# Patient Record
Sex: Female | Born: 1975 | Race: White | Hispanic: No | Marital: Married | Smoking: Never smoker
Health system: Southern US, Community
[De-identification: ages and names within clinical notes are randomized; demographics above are authoritative.]

## PROBLEM LIST (undated history)

## (undated) DIAGNOSIS — F329 Major depressive disorder, single episode, unspecified: Secondary | ICD-10-CM

## (undated) DIAGNOSIS — F32A Depression, unspecified: Secondary | ICD-10-CM

## (undated) DIAGNOSIS — E7800 Pure hypercholesterolemia, unspecified: Secondary | ICD-10-CM

## (undated) DIAGNOSIS — K219 Gastro-esophageal reflux disease without esophagitis: Secondary | ICD-10-CM

## (undated) DIAGNOSIS — F419 Anxiety disorder, unspecified: Secondary | ICD-10-CM

## (undated) HISTORY — DX: Depression, unspecified: F32.A

## (undated) HISTORY — PX: BREAST REDUCTION SURGERY: SHX8

## (undated) HISTORY — DX: Anxiety disorder, unspecified: F41.9

## (undated) HISTORY — PX: NOSE SURGERY: SHX723

## (undated) HISTORY — DX: Pure hypercholesterolemia, unspecified: E78.00

## (undated) HISTORY — DX: Gastro-esophageal reflux disease without esophagitis: K21.9

---
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
## 1998-09-16 HISTORY — PX: MOUTH SURGERY: SHX715

## 2020-01-12 ENCOUNTER — Other Ambulatory Visit: Payer: Self-pay

## 2020-01-12 ENCOUNTER — Encounter: Payer: Self-pay | Admitting: Urology

## 2020-01-12 ENCOUNTER — Ambulatory Visit: Payer: BC Managed Care – PPO | Admitting: Urology

## 2020-01-12 VITALS — BP 113/78 | HR 73 | Temp 98.1°F | Ht 64.0 in | Wt 156.0 lb

## 2020-01-12 DIAGNOSIS — N390 Urinary tract infection, site not specified: Secondary | ICD-10-CM | POA: Diagnosis not present

## 2020-01-12 LAB — POCT URINALYSIS DIPSTICK
Bilirubin, UA: NEGATIVE
Blood, UA: NEGATIVE
Clarity, UA: NEGATIVE
Glucose, UA: NEGATIVE
Ketones, UA: NEGATIVE
Nitrite, UA: NEGATIVE
Protein, UA: POSITIVE — AB
Spec Grav, UA: 1.015 (ref 1.010–1.025)
Urobilinogen, UA: 0.2 E.U./dL
pH, UA: 7 (ref 5.0–8.0)

## 2020-01-12 LAB — BLADDER SCAN AMB NON-IMAGING: Scan Result: 80.4

## 2020-01-12 NOTE — Patient Instructions (Signed)
Hematuria, Adult Hematuria is blood in the urine. Blood may be visible in the urine, or it may be identified with a test. This condition can be caused by infections of the bladder, urethra, kidney, or prostate. Other possible causes include:  Kidney stones.  Cancer of the urinary tract.  Too much calcium in the urine.  Conditions that are passed from parent to child (inherited conditions).  Exercise that requires a lot of energy. Infections can usually be treated with medicine, and a kidney stone usually will pass through your urine. If neither of these is the cause of your hematuria, more tests may be needed to identify the cause of your symptoms. It is very important to tell your health care provider about any blood in your urine, even if it is painless or the blood stops without treatment. Blood in the urine, when it happens and then stops and then happens again, can be a symptom of a very serious condition, including cancer. There is no pain in the initial stages of many urinary cancers. Follow these instructions at home: Medicines  Take over-the-counter and prescription medicines only as told by your health care provider.  If you were prescribed an antibiotic medicine, take it as told by your health care provider. Do not stop taking the antibiotic even if you start to feel better. Eating and drinking  Drink enough fluid to keep your urine clear or pale yellow. It is recommended that you drink 3-4 quarts (2.8-3.8 L) a day. If you have been diagnosed with an infection, it is recommended that you drink cranberry juice in addition to large amounts of water.  Avoid caffeine, tea, and carbonated beverages. These tend to irritate the bladder.  Avoid alcohol because it may irritate the prostate (men). General instructions  If you have been diagnosed with a kidney stone, follow your health care provider's instructions about straining your urine to catch the stone.  Empty your bladder  often. Avoid holding urine for long periods of time.  If you are female: ? After a bowel movement, wipe from front to back and use each piece of toilet paper only once. ? Empty your bladder before and after sex.  Pay attention to any changes in your symptoms. Tell your health care provider about any changes or any new symptoms.  It is your responsibility to get your test results. Ask your health care provider, or the department performing the test, when your results will be ready.  Keep all follow-up visits as told by your health care provider. This is important. Contact a health care provider if:  You develop back pain.  You have a fever.  You have nausea or vomiting.  Your symptoms do not improve after 3 days.  Your symptoms get worse. Get help right away if:  You develop severe vomiting and are unable take medicine without vomiting.  You develop severe pain in your back or abdomen even though you are taking medicine.  You pass a large amount of blood in your urine.  You pass blood clots in your urine.  You feel very weak or like you might faint.  You faint. Summary  Hematuria is blood in the urine. It has many possible causes.  It is very important that you tell your health care provider about any blood in your urine, even if it is painless or the blood stops without treatment.  Take over-the-counter and prescription medicines only as told by your health care provider.  Drink enough fluid to keep   your urine clear or pale yellow. This information is not intended to replace advice given to you by your health care provider. Make sure you discuss any questions you have with your health care provider. Document Revised: 01/27/2019 Document Reviewed: 10/05/2016 Elsevier Patient Education  2020 Elsevier Inc.  

## 2020-01-12 NOTE — Progress Notes (Signed)
Urological Symptom Review  Patient is experiencing the following symptoms: Frequent urination Hard to postpone urination Burning/pain with urination Leakage of urine Blood in urine Urinary tract infection Painful intercourse   Review of Systems  Gastrointestinal (upper)  : Negative for upper GI symptoms  Gastrointestinal (lower) : Negative for lower GI symptoms  Constitutional : Negative for symptoms  Skin: Negative for skin symptoms  Eyes: Negative for eye symptoms  Ear/Nose/Throat : Sinus problems  Hematologic/Lymphatic: Negative for Hematologic/Lymphatic symptoms  Cardiovascular : Negative for cardiovascular symptoms  Respiratory : Negative for respiratory symptoms  Endocrine: Negative for endocrine symptoms  Musculoskeletal: Back pain  Neurological: Negative for neurological symptoms  Psychologic: Depression Anxiety

## 2020-01-12 NOTE — Progress Notes (Signed)
01/12/2020 4:14 PM   Jearld Lesch 1976-03-15 [id number]  Referring provider: Rosalee Kaufman, PA-C [place],  [place] [address]  Recurrent UTIs  HPI: Ms Kristie Davidson is a 44yo here for evaluation of recurrent UTIs. For the past 2 years she has had 5-6 UTIs. She was treated multiple times with antibiotics and symptoms would improve but then return. Last UTI was 2 months ago. She has had previous episodes of gross hematuria with UTI. She gets dysuria, urinary frequency with her UTIs. No hx of nephrolithiasis. She had a renal US which per patient showed cortical thinning. No UTIs as child . She does have worsenign pain after intercourse   PMH: Past Medical History:  Diagnosis Date  . Acid reflux   . Anxiety   . Depression   . High cholesterol     Surgical History: Past Surgical History:  Procedure Laterality Date  . BREAST REDUCTION SURGERY    . CESAREAN SECTION    . MOUTH SURGERY  2000  . NOSE SURGERY      Home Medications:  Allergies as of 01/12/2020   Not on File     Medication List       Accurate as of January 12, 2020  4:14 PM. If you have any questions, ask your nurse or doctor.        amphetamine-dextroamphetamine 20 MG tablet Commonly known as: ADDERALL Take 20 mg by mouth 2 (two) times daily.   cetirizine 10 MG tablet Commonly known as: ZYRTEC Take 10 mg by mouth daily.   ferrous sulfate 325 (65 FE) MG tablet Take by mouth.   LORazepam 1 MG tablet Commonly known as: ATIVAN Take 1 mg by mouth daily.   montelukast 10 MG tablet Commonly known as: SINGULAIR Take 10 mg by mouth daily.   omeprazole 20 MG capsule Commonly known as: PRILOSEC Take 20 mg by mouth daily.   predniSONE 20 MG tablet Commonly known as: DELTASONE Take 40 mg by mouth daily.   simvastatin 20 MG tablet Commonly known as: ZOCOR SMARTSIG:1 Tablet(s) By Mouth Every Evening       Allergies: Not on File  Family History: Family History  Problem Relation Age  of Onset  . Kidney Stones Mother   . Kidney Stones Sister     Social History:  reports that she has never smoked. She has never used smokeless tobacco. No history on file for alcohol and drug.  ROS: All other review of systems were reviewed and are negative except what is noted above in HPI  Physical Exam: BP 113/78   Pulse 73   Temp 98.1 F (36.7 C)   Ht 5\' 4"  (1.626 m)   Wt 156 lb (70.8 kg)   BMI 26.78 kg/m   Constitutional:  Alert and oriented, No acute distress. HEENT: [place] AT, moist mucus membranes.  Trachea midline, no masses. Cardiovascular: No clubbing, cyanosis, or edema. Respiratory: Normal respiratory effort, no increased work of breathing. GI: Abdomen is soft, nontender, nondistended, no abdominal masses GU: No CVA tenderness.   Lymph: No cervical or inguinal lymphadenopathy. Skin: No rashes, bruises or suspicious lesions. Neurologic: Grossly intact, no focal deficits, moving all 4 extremities. Psychiatric: Normal mood and affect.  Laboratory Data: No results found for: WBC, HGB, HCT, MCV, PLT  No results found for: CREATININE  No results found for: PSA  No results found for: TESTOSTERONE  No results found for: HGBA1C  Urinalysis    Component Value Date/Time   BILIRUBINUR neg 01/12/2020  1552   PROTEINUR Positive (A) 01/12/2020 1552   UROBILINOGEN 0.2 01/12/2020 1552   NITRITE neg 01/12/2020 1552   LEUKOCYTESUR Moderate (2+) (A) 01/12/2020 1552    No results found for: LABMICR, WBCUA, RBCUA, LABEPIT, MUCUS, BACTERIA  Pertinent Imaging:  No results found for this or any previous visit. No results found for this or any previous visit. No results found for this or any previous visit. No results found for this or any previous visit. No results found for this or any previous visit. No results found for this or any previous visit. No results found for this or any previous visit. No results found for this or any previous visit.  Assessment & Plan:     1. Recurrent UTI -We discussed the workup including BMP and CT hematuria. - BLADDER SCAN AMB NON-IMAGING - POCT urinalysis dipstick   No follow-ups on file.  Wilkie Aye, MD  [facility] [place]

## 2020-02-02 ENCOUNTER — Other Ambulatory Visit: Payer: Self-pay | Admitting: Urology

## 2020-02-03 LAB — BASIC METABOLIC PANEL
BUN: 13 mg/dL (ref 7–25)
CO2: 29 mmol/L (ref 20–32)
Calcium: 9.3 mg/dL (ref 8.6–10.2)
Chloride: 103 mmol/L (ref 98–110)
Creat: 0.77 mg/dL (ref 0.50–1.10)
Glucose, Bld: 98 mg/dL (ref 65–139)
Potassium: 3.7 mmol/L (ref 3.5–5.3)
Sodium: 138 mmol/L (ref 135–146)

## 2020-02-08 NOTE — Progress Notes (Signed)
Letter mailed

## 2020-02-09 ENCOUNTER — Ambulatory Visit (HOSPITAL_COMMUNITY): Admission: RE | Admit: 2020-02-09 | Payer: BC Managed Care – PPO | Source: Ambulatory Visit

## 2020-02-15 ENCOUNTER — Ambulatory Visit (HOSPITAL_COMMUNITY)
Admission: RE | Admit: 2020-02-15 | Discharge: 2020-02-15 | Disposition: A | Discharge status: Home / Self Care | Payer: BC Managed Care – PPO | Source: Ambulatory Visit | Attending: Urology | Admitting: Urology

## 2020-02-15 ENCOUNTER — Other Ambulatory Visit: Payer: Self-pay

## 2020-02-15 DIAGNOSIS — N390 Urinary tract infection, site not specified: Secondary | ICD-10-CM | POA: Diagnosis present

## 2020-02-15 MED ORDER — IOHEXOL 300 MG/ML  SOLN
150.0000 mL | Freq: Once | INTRAMUSCULAR | Status: AC | PRN
  Administered 2020-02-15: 125 mL via INTRAVENOUS

## 2020-02-16 ENCOUNTER — Ambulatory Visit (INDEPENDENT_AMBULATORY_CARE_PROVIDER_SITE_OTHER): Payer: BC Managed Care – PPO | Admitting: Urology

## 2020-02-16 ENCOUNTER — Encounter: Payer: Self-pay | Admitting: Urology

## 2020-02-16 VITALS — BP 115/81 | HR 73 | Temp 97.9°F | Ht 64.0 in | Wt 160.0 lb

## 2020-02-16 DIAGNOSIS — N200 Calculus of kidney: Secondary | ICD-10-CM | POA: Diagnosis not present

## 2020-02-16 DIAGNOSIS — N390 Urinary tract infection, site not specified: Secondary | ICD-10-CM | POA: Diagnosis not present

## 2020-02-16 MED ORDER — TAMSULOSIN HCL 0.4 MG PO CAPS: 0.4000 mg | ORAL_CAPSULE | Freq: Every day | ORAL | 11 refills | Status: DC

## 2020-02-16 NOTE — Progress Notes (Signed)
Urological Symptom Review  Patient is experiencing the following symptoms: Frequent urination Hard to postpone urination Painful intercourse   Review of Systems  Gastrointestinal (upper)  : Indigestion/heartburn  Gastrointestinal (lower) : Negative for lower GI symptoms  Constitutional : Negative for symptoms  Skin: Negative for skin symptoms  Eyes: Negative for eye symptoms  Ear/Nose/Throat : Negative for Ear/Nose/Throat symptoms  Hematologic/Lymphatic: Negative for Hematologic/Lymphatic symptoms  Cardiovascular : Negative for cardiovascular symptoms  Respiratory : Negative for respiratory symptoms  Endocrine: Negative for endocrine symptoms  Musculoskeletal: Back pain  Neurological: Negative for neurological symptoms  Psychologic: Negative for psychiatric symptoms

## 2020-02-16 NOTE — Progress Notes (Signed)
02/16/2020 3:16 PM   Kristie Davidson 09-11-1976 [id number]  Referring provider: No referring provider defined for this encounter.  Recurrent UTI  HPI: Kristie Davidson is a 43yo here for followup for recurrent UTI. CT hematuria shows nephrocalcinosis. She has intermittent left flank pain that is sharp and mild. No fevers. NO hematuria.   PMH: Past Medical History:  Diagnosis Date  . Acid reflux   . Anxiety   . Depression   . High cholesterol     Surgical History: Past Surgical History:  Procedure Laterality Date  . BREAST REDUCTION SURGERY    . CESAREAN SECTION    . MOUTH SURGERY  2000  . NOSE SURGERY      Home Medications:  Allergies as of 02/16/2020   Not on File     Medication List       Accurate as of February 16, 2020  3:16 PM. If you have any questions, ask your nurse or doctor.        amphetamine-dextroamphetamine 20 MG tablet Commonly known as: ADDERALL Take 20 mg by mouth 2 (two) times daily.   cetirizine 10 MG tablet Commonly known as: ZYRTEC Take 10 mg by mouth daily.   ferrous sulfate 325 (65 FE) MG tablet Take by mouth.   LORazepam 1 MG tablet Commonly known as: ATIVAN Take 1 mg by mouth as needed.   montelukast 10 MG tablet Commonly known as: SINGULAIR Take 10 mg by mouth daily.   omeprazole 20 MG capsule Commonly known as: PRILOSEC Take 20 mg by mouth daily.   predniSONE 20 MG tablet Commonly known as: DELTASONE Take 40 mg by mouth daily.   simvastatin 20 MG tablet Commonly known as: ZOCOR SMARTSIG:1 Tablet(s) By Mouth Every Evening       Allergies: Not on File  Family History: Family History  Problem Relation Age of Onset  . Kidney Stones Mother   . Kidney Stones Sister     Social History:  reports that she has never smoked. She has never used smokeless tobacco. No history on file for alcohol and drug.  ROS: All other review of systems were reviewed and are negative except what is noted above in HPI  Physical Exam: BP  115/81   Pulse 73   Temp 97.9 F (36.6 C)   Ht 5\' 4"  (1.626 m)   Wt 160 lb (72.6 kg)   BMI 27.46 kg/m   Constitutional:  Alert and oriented, No acute distress. HEENT: [place] AT, moist mucus membranes.  Trachea midline, no masses. Cardiovascular: No clubbing, cyanosis, or edema. Respiratory: Normal respiratory effort, no increased work of breathing. GI: Abdomen is soft, nontender, nondistended, no abdominal masses GU: No CVA tenderness. .  Lymph: No cervical or inguinal lymphadenopathy. Skin: No rashes, bruises or suspicious lesions. Neurologic: Grossly intact, no focal deficits, moving all 4 extremities. Psychiatric: Normal mood and affect.  Laboratory Data: No results found for: WBC, HGB, HCT, MCV, PLT  Lab Results  Component Value Date   CREATININE 0.77 02/02/2020    No results found for: PSA  No results found for: TESTOSTERONE  No results found for: HGBA1C  Urinalysis    Component Value Date/Time   BILIRUBINUR neg 01/12/2020 1552   PROTEINUR Positive (A) 01/12/2020 1552   UROBILINOGEN 0.2 01/12/2020 1552   NITRITE neg 01/12/2020 1552   LEUKOCYTESUR Moderate (2+) (A) 01/12/2020 1552    No results found for: LABMICR, WBCUA, RBCUA, LABEPIT, MUCUS, BACTERIA  Pertinent Imaging: CT hematuria 02/15/2020: Images reviewed and discussed with  the patient No results found for this or any previous visit. No results found for this or any previous visit. No results found for this or any previous visit. No results found for this or any previous visit. No results found for this or any previous visit. No results found for this or any previous visit. Results for orders placed during the hospital encounter of 02/15/20  CT HEMATURIA WORKUP   Narrative CLINICAL DATA:  Gross hematuria. Recurrent urinary tract infections.  EXAM: CT ABDOMEN AND PELVIS WITHOUT AND WITH CONTRAST  TECHNIQUE: Multidetector CT imaging of the abdomen and pelvis was performed following the standard  protocol before and following the bolus administration of intravenous contrast.  CONTRAST:  170mL OMNIPAQUE IOHEXOL 300 MG/ML  SOLN  COMPARISON:  None.  FINDINGS: Lower Chest: No acute findings.  Hepatobiliary: No hepatic masses identified. Gallbladder is unremarkable. No evidence of biliary ductal dilatation.  Pancreas:  No mass or inflammatory changes.  Spleen: Within normal limits in size and appearance.  Adrenals/Urinary Tract: No adrenal masses identified. Numerous tiny calcifications are seen throughout the medullary pyramids both kidneys, consistent with medullary nephrocalcinosis. No evidence ureteral calculi or hydronephrosis. No renal masses identified. No masses seen involving the ureters or bladder.  Stomach/Bowel: No evidence of obstruction, inflammatory process or abnormal fluid collections. Normal appendix visualized.  Vascular/Lymphatic: No pathologically enlarged lymph nodes. No abdominal aortic aneurysm.  Reproductive:  No mass or other significant abnormality.  Other:  None.  Musculoskeletal:  No suspicious bone lesions identified.  IMPRESSION: 1. Bilateral renal medullary nephrocalcinosis. No evidence of ureteral calculi or hydronephrosis. 2. No radiographic evidence of urinary tract neoplasm.   Electronically Signed   By: Marlaine Hind M.D.   On: 02/16/2020 11:52    No results found for this or any previous visit.  Assessment & Plan:    1. Recurrent UTI -CT hematuria shows nephrocalcinosis.  2. Nephrolithiasis -Flomax 0.4mg  dai;y -motrin prn   No follow-ups on file.  Nicolette Bang, MD  [facility] [place]

## 2020-02-16 NOTE — Patient Instructions (Signed)

## 2020-03-23 ENCOUNTER — Encounter: Payer: Self-pay | Admitting: Urology

## 2020-03-23 ENCOUNTER — Other Ambulatory Visit: Payer: Self-pay

## 2020-03-23 ENCOUNTER — Ambulatory Visit (INDEPENDENT_AMBULATORY_CARE_PROVIDER_SITE_OTHER): Payer: BC Managed Care – PPO | Admitting: Urology

## 2020-03-23 VITALS — BP 96/63 | HR 80 | Temp 97.9°F | Ht 64.0 in | Wt 163.0 lb

## 2020-03-23 DIAGNOSIS — N200 Calculus of kidney: Secondary | ICD-10-CM

## 2020-03-23 DIAGNOSIS — N390 Urinary tract infection, site not specified: Secondary | ICD-10-CM | POA: Diagnosis not present

## 2020-03-23 MED ORDER — TAMSULOSIN HCL 0.4 MG PO CAPS: 0.4000 mg | ORAL_CAPSULE | Freq: Every day | ORAL | 3 refills | Status: DC

## 2020-03-23 NOTE — Patient Instructions (Signed)

## 2020-03-23 NOTE — Progress Notes (Signed)
Urological Symptom Review ° °Patient is experiencing the following symptoms: °Frequent urination °Hard to postpone urination ° ° °Review of Systems ° °Gastrointestinal (upper)  : °Negative for upper GI symptoms ° °Gastrointestinal (lower) : °Negative for lower GI symptoms ° °Constitutional : °Negative for symptoms ° °Skin: °Negative for skin symptoms ° °Eyes: °Negative for eye symptoms ° °Ear/Nose/Throat : °Negative for Ear/Nose/Throat symptoms ° °Hematologic/Lymphatic: °Negative for Hematologic/Lymphatic symptoms ° °Cardiovascular : °Negative for cardiovascular symptoms ° °Respiratory : °Negative for respiratory symptoms ° °Endocrine: °Negative for endocrine symptoms ° °Musculoskeletal: °Negative for musculoskeletal symptoms ° °Neurological: °Negative for neurological symptoms ° °Psychologic: °Negative for psychiatric symptoms ° °

## 2020-03-23 NOTE — Progress Notes (Signed)
03/23/2020 11:14 AM   Kristie Davidson 07-12-1976 [id number]  Referring provider: Practice, [facility] [address] [place],  [place] [address]  Nephrolithasis and back pain  HPI: Kristie Davidson is a 43yo here for followup for recurrent UTI. No UTIs since last visit. Stream good. Flank pain resolved with flomax 0.4mg  daily. She does have mild SUI with coughing spells.    PMH: Past Medical History:  Diagnosis Date  . Acid reflux   . Anxiety   . Depression   . High cholesterol     Surgical History: Past Surgical History:  Procedure Laterality Date  . BREAST REDUCTION SURGERY    . CESAREAN SECTION    . MOUTH SURGERY  2000  . NOSE SURGERY      Home Medications:  Allergies as of 03/23/2020      Reactions   Sumatriptan    Nausea,cold sweats,tremors,chills      Medication List       Accurate as of March 23, 2020 11:14 AM. If you have any questions, ask your nurse or doctor.        STOP taking these medications   predniSONE 20 MG tablet Commonly known as: DELTASONE Stopped by: Wilkie Aye, MD     TAKE these medications   amphetamine-dextroamphetamine 20 MG tablet Commonly known as: ADDERALL Take 20 mg by mouth 2 (two) times daily.   cetirizine 10 MG tablet Commonly known as: ZYRTEC Take 10 mg by mouth daily.   ferrous sulfate 325 (65 FE) MG tablet Take by mouth.   LORazepam 1 MG tablet Commonly known as: ATIVAN Take 1 mg by mouth as needed.   montelukast 10 MG tablet Commonly known as: SINGULAIR Take 10 mg by mouth daily.   omeprazole 20 MG capsule Commonly known as: PRILOSEC Take 20 mg by mouth daily.   simvastatin 20 MG tablet Commonly known as: ZOCOR SMARTSIG:1 Tablet(s) By Mouth Every Evening   tamsulosin 0.4 MG Caps capsule Commonly known as: FLOMAX Take 1 capsule (0.4 mg total) by mouth daily.       Allergies:  Allergies  Allergen Reactions  . Sumatriptan     Nausea,cold sweats,tremors,chills    Family History: Family History    Problem Relation Age of Onset  . Kidney Stones Mother   . Kidney Stones Sister     Social History:  reports that she has never smoked. She has never used smokeless tobacco. No history on file for alcohol use and drug use.  ROS: All other review of systems were reviewed and are negative except what is noted above in HPI  Physical Exam: BP 96/63   Pulse 80   Temp 97.9 F (36.6 C)   Ht 5\' 4"  (1.626 m)   Wt 163 lb (73.9 kg)   BMI 27.98 kg/m   Constitutional:  Alert and oriented, No acute distress. HEENT: [place] AT, moist mucus membranes.  Trachea midline, no masses. Cardiovascular: No clubbing, cyanosis, or edema. Respiratory: Normal respiratory effort, no increased work of breathing. GI: Abdomen is soft, nontender, nondistended, no abdominal masses GU: No CVA tenderness.  Lymph: No cervical or inguinal lymphadenopathy. Skin: No rashes, bruises or suspicious lesions. Neurologic: Grossly intact, no focal deficits, moving all 4 extremities. Psychiatric: Normal mood and affect.  Laboratory Data: No results found for: WBC, HGB, HCT, MCV, PLT  Lab Results  Component Value Date   CREATININE 0.77 02/02/2020    No results found for: PSA  No results found for: TESTOSTERONE  No results found for:  HGBA1C  Urinalysis    Component Value Date/Time   BILIRUBINUR neg 01/12/2020 1552   PROTEINUR Positive (A) 01/12/2020 1552   UROBILINOGEN 0.2 01/12/2020 1552   NITRITE neg 01/12/2020 1552   LEUKOCYTESUR Moderate (2+) (A) 01/12/2020 1552    No results found for: LABMICR, WBCUA, RBCUA, LABEPIT, MUCUS, BACTERIA  Pertinent Imaging:  No results found for this or any previous visit.  No results found for this or any previous visit.  No results found for this or any previous visit.  No results found for this or any previous visit.  No results found for this or any previous visit.  No results found for this or any previous visit.  Results for orders placed during the hospital  encounter of 02/15/20  CT HEMATURIA WORKUP  Narrative CLINICAL DATA:  Gross hematuria. Recurrent urinary tract infections.  EXAM: CT ABDOMEN AND PELVIS WITHOUT AND WITH CONTRAST  TECHNIQUE: Multidetector CT imaging of the abdomen and pelvis was performed following the standard protocol before and following the bolus administration of intravenous contrast.  CONTRAST:  OMNIPAQUE IOHEXOL 300 MG/ML  SOLN  COMPARISON:  None.  FINDINGS: Lower Chest: No acute findings.  Hepatobiliary: No hepatic masses identified. Gallbladder is unremarkable. No evidence of biliary ductal dilatation.  Pancreas:  No mass or inflammatory changes.  Spleen: Within normal limits in size and appearance.  Adrenals/Urinary Tract: No adrenal masses identified. Numerous tiny calcifications are seen throughout the medullary pyramids both kidneys, consistent with medullary nephrocalcinosis. No evidence ureteral calculi or hydronephrosis. No renal masses identified. No masses seen involving the ureters or bladder.  Stomach/Bowel: No evidence of obstruction, inflammatory process or abnormal fluid collections. Normal appendix visualized.  Vascular/Lymphatic: No pathologically enlarged lymph nodes. No abdominal aortic aneurysm.  Reproductive:  No mass or other significant abnormality.  Other:  None.  Musculoskeletal:  No suspicious bone lesions identified.  IMPRESSION: 1. Bilateral renal medullary nephrocalcinosis. No evidence of ureteral calculi or hydronephrosis. 2. No radiographic evidence of urinary tract neoplasm.   Electronically Signed By: Danae Orleans M.D. On: 02/16/2020 11:52  No results found for this or any previous visit.   Assessment & Plan:    1. Recurrent UTI -continue observation -continue flomax 0.4mg  daily  2. Nephrolithiasis -RTC 6 months with renal US and KUB  3. Mild SUI -we will trial mirabegron   No follow-ups on file.  Wilkie Aye, MD  [facility] [place]

## 2020-04-05 ENCOUNTER — Telehealth: Payer: Self-pay | Admitting: Urology

## 2020-04-05 NOTE — Telephone Encounter (Signed)
Pt unsure of dose. You gave her samples of myrbetriq. I assuming you start with 25mg ? Pt is wanting a Rx

## 2020-04-05 NOTE — Telephone Encounter (Signed)
Called message to verify sample medication dosage. Left message to return call.

## 2020-04-05 NOTE — Telephone Encounter (Signed)
PT STATES MED SAMPLE THAT WAS GIVEN AT LAST VISIT WORKED AND SHE WOULD LIKE A RX SENT TO [facility].

## 2020-04-11 ENCOUNTER — Other Ambulatory Visit: Payer: Self-pay

## 2020-04-11 DIAGNOSIS — N390 Urinary tract infection, site not specified: Secondary | ICD-10-CM

## 2020-04-11 MED ORDER — MIRABEGRON ER 25 MG PO TB24: 25.0000 mg | ORAL_TABLET | Freq: Every day | ORAL | 11 refills | Status: DC

## 2020-04-11 NOTE — Telephone Encounter (Signed)
It was 2.5 mg.

## 2020-04-11 NOTE — Telephone Encounter (Signed)
Rx sent in for pt. Pt notified.

## 2020-09-21 ENCOUNTER — Other Ambulatory Visit: Payer: Self-pay

## 2020-09-21 ENCOUNTER — Ambulatory Visit (HOSPITAL_COMMUNITY)
Admission: RE | Admit: 2020-09-21 | Discharge: 2020-09-21 | Disposition: A | Discharge status: Home / Self Care | Payer: 59 | Source: Ambulatory Visit | Attending: Urology | Admitting: Urology

## 2020-09-21 DIAGNOSIS — N200 Calculus of kidney: Secondary | ICD-10-CM | POA: Diagnosis present

## 2020-09-28 ENCOUNTER — Ambulatory Visit: Payer: BC Managed Care – PPO | Admitting: Urology

## 2020-11-15 ENCOUNTER — Other Ambulatory Visit: Payer: Self-pay

## 2020-11-15 ENCOUNTER — Ambulatory Visit (INDEPENDENT_AMBULATORY_CARE_PROVIDER_SITE_OTHER): Payer: 59 | Admitting: Urology

## 2020-11-15 ENCOUNTER — Encounter: Payer: Self-pay | Admitting: Urology

## 2020-11-15 VITALS — BP 127/83 | HR 91 | Temp 99.2°F

## 2020-11-15 DIAGNOSIS — N200 Calculus of kidney: Secondary | ICD-10-CM | POA: Diagnosis not present

## 2020-11-15 LAB — URINALYSIS, ROUTINE W REFLEX MICROSCOPIC
Bilirubin, UA: NEGATIVE
Glucose, UA: NEGATIVE
Leukocytes,UA: NEGATIVE
Nitrite, UA: NEGATIVE
RBC, UA: NEGATIVE
Specific Gravity, UA: >1.03 — ABNORMAL HIGH (ref 1.005–1.030)
Urobilinogen, Ur: 1 mg/dL (ref 0.2–1.0)
pH, UA: 5.5 (ref 5.0–7.5)

## 2020-11-15 MED ORDER — TAMSULOSIN HCL 0.4 MG PO CAPS: 0.4000 mg | ORAL_CAPSULE | Freq: Every day | ORAL | 3 refills | Status: DC

## 2020-11-15 NOTE — Progress Notes (Signed)
Urological Symptom Review  Patient is experiencing the following symptoms: Frequent urination Leakage of urine   Review of Systems  Gastrointestinal (upper)  : Negative for upper GI symptoms  Gastrointestinal (lower) : Negative for lower GI symptoms  Constitutional : Negative for symptoms  Skin: Negative for skin symptoms  Eyes: Negative for eye symptoms  Ear/Nose/Throat : Negative for Ear/Nose/Throat symptoms  Hematologic/Lymphatic: Easy bruising  Cardiovascular : Negative for cardiovascular symptoms  Respiratory : Negative for respiratory symptoms  Endocrine: negative  Musculoskeletal: Negative for musculoskeletal symptoms  Neurological: Headaches  Psychologic: Negative for psychiatric symptoms

## 2020-11-15 NOTE — Patient Instructions (Signed)

## 2020-11-15 NOTE — Progress Notes (Signed)
11/15/2020 3:45 PM   Kristie Davidson [birth year] [id number]  Referring provider: Practice, [facility] [address] [place],  [place] [address]  followup recurrent UTI and nephrolithiasis  HPI: Kristie Davidson is a 44yo here for followup for recurrent UTI and nephrolithiasis. No UTIs since last visit. No stone events since last visit. Renal US shows nephrocalcinosis. No flank pain. No worsening LUTS. She is on flomax 0.4mg  daily.    PMH: Past Medical History:  Diagnosis Date  . Acid reflux   . Anxiety   . Depression   . High cholesterol     Surgical History: Past Surgical History:  Procedure Laterality Date  . BREAST REDUCTION SURGERY    . CESAREAN SECTION    . MOUTH SURGERY  2000  . NOSE SURGERY      Home Medications:  Allergies as of 11/15/2020      Reactions   Sumatriptan    Nausea,cold sweats,tremors,chills      Medication List       Accurate as of November 15, 2020  3:45 PM. If you have any questions, ask your nurse or doctor.        amphetamine-dextroamphetamine 20 MG tablet Commonly known as: ADDERALL Take 20 mg by mouth 2 (two) times daily.   cetirizine 10 MG tablet Commonly known as: ZYRTEC Take 10 mg by mouth daily.   ferrous sulfate 325 (65 FE) MG tablet Take by mouth.   LORazepam 1 MG tablet Commonly known as: ATIVAN Take 1 mg by mouth as needed.   mirabegron ER 25 MG Tb24 tablet Commonly known as: MYRBETRIQ Take 1 tablet (25 mg total) by mouth daily.   montelukast 10 MG tablet Commonly known as: SINGULAIR Take 10 mg by mouth daily.   omeprazole 20 MG capsule Commonly known as: PRILOSEC Take 20 mg by mouth daily.   simvastatin 20 MG tablet Commonly known as: ZOCOR SMARTSIG:1 Tablet(s) By Mouth Every Evening   tamsulosin 0.4 MG Caps capsule Commonly known as: FLOMAX Take 1 capsule (0.4 mg total) by mouth daily.       Allergies:  Allergies  Allergen Reactions  . Sumatriptan     Nausea,cold sweats,tremors,chills    Family  History: Family History  Problem Relation Age of Onset  . Kidney Stones Mother   . Kidney Stones Sister     Social History:  reports that she has never smoked. She has never used smokeless tobacco. No history on file for alcohol use and drug use.  ROS: All other review of systems were reviewed and are negative except what is noted above in HPI  Physical Exam: BP 127/83   Pulse 91   Temp 99.2 F (37.3 C)   Constitutional:  Alert and oriented, No acute distress. HEENT: Kristie Davidson AT, moist mucus membranes.  Trachea midline, no masses. Cardiovascular: No clubbing, cyanosis, or edema. Respiratory: Normal respiratory effort, no increased work of breathing. GI: Abdomen is soft, nontender, nondistended, no abdominal masses GU: No CVA tenderness.  Lymph: No cervical or inguinal lymphadenopathy. Skin: No rashes, bruises or suspicious lesions. Neurologic: Grossly intact, no focal deficits, moving all 4 extremities. Psychiatric: Normal mood and affect.  Laboratory Data: No results found for: WBC, HGB, HCT, MCV, PLT  Lab Results  Component Value Date   CREATININE 0.77 02/02/2020    No results found for: PSA  No results found for: TESTOSTERONE  No results found for: HGBA1C  Urinalysis    Component Value Date/Time   BILIRUBINUR neg 01/12/2020 1552   PROTEINUR  Positive (A) 01/12/2020 1552   UROBILINOGEN 0.2 01/12/2020 1552   NITRITE neg 01/12/2020 1552   LEUKOCYTESUR Moderate (2+) (A) 01/12/2020 1552    No results found for: LABMICR, WBCUA, RBCUA, LABEPIT, MUCUS, BACTERIA  Pertinent Imaging: Renal US 09/21/2020: Images reviewed and discussed with the patient No results found for this or any previous visit.  No results found for this or any previous visit.  No results found for this or any previous visit.  No results found for this or any previous visit.  Results for orders placed during the hospital encounter of 09/21/20  Ultrasound renal complete  Narrative CLINICAL DATA:   Nephrolithiasis  EXAM: RENAL / URINARY TRACT ULTRASOUND COMPLETE  COMPARISON:  Renal ultrasound November 18, 2019; CT abdomen and pelvis February 15, 2020  FINDINGS: Right Kidney:  Renal measurements: 10.9 x 4.8 x 6.2 cm = volume: 170.3 mL. Echogenicity within normal limits. No mass or hydronephrosis visualized. Subtle apparent medullary nephrocalcinosis, better seen on recent CT. No ureterectasis.  Left Kidney:  Renal measurements: 10.8 x 5.5 x 4.6 cm = volume: 141.4 mL. Echogenicity within normal limits. No mass or hydronephrosis visualized. Subtle apparent medullary nephrocalcinosis, better seen on recent CT. No ureterectasis.  Bladder:  Appears normal for degree of bladder distention. Flow from each distal ureter seen in the bladder.  Other:  None.  IMPRESSION: 1. Subtle changes of apparent medullary nephrocalcinosis, better seen on prior CT.  2. Kidneys otherwise appear normal bilaterally. No lesion involving urinary bladder.   Electronically Signed By: Bretta Bang III M.D. On: 09/21/2020 14:09  No results found for this or any previous visit.  Results for orders placed during the hospital encounter of 02/15/20  CT HEMATURIA WORKUP  Narrative CLINICAL DATA:  Gross hematuria. Recurrent urinary tract infections.  EXAM: CT ABDOMEN AND PELVIS WITHOUT AND WITH CONTRAST  TECHNIQUE: Multidetector CT imaging of the abdomen and pelvis was performed following the standard protocol before and following the bolus administration of intravenous contrast.  CONTRAST:  OMNIPAQUE IOHEXOL 300 MG/ML  SOLN  COMPARISON:  None.  FINDINGS: Lower Chest: No acute findings.  Hepatobiliary: No hepatic masses identified. Gallbladder is unremarkable. No evidence of biliary ductal dilatation.  Pancreas:  No mass or inflammatory changes.  Spleen: Within normal limits in size and appearance.  Adrenals/Urinary Tract: No adrenal masses identified. Numerous  tiny calcifications are seen throughout the medullary pyramids both kidneys, consistent with medullary nephrocalcinosis. No evidence ureteral calculi or hydronephrosis. No renal masses identified. No masses seen involving the ureters or bladder.  Stomach/Bowel: No evidence of obstruction, inflammatory process or abnormal fluid collections. Normal appendix visualized.  Vascular/Lymphatic: No pathologically enlarged lymph nodes. No abdominal aortic aneurysm.  Reproductive:  No mass or other significant abnormality.  Other:  None.  Musculoskeletal:  No suspicious bone lesions identified.  IMPRESSION: 1. Bilateral renal medullary nephrocalcinosis. No evidence of ureteral calculi or hydronephrosis. 2. No radiographic evidence of urinary tract neoplasm.   Electronically Signed By: Danae Orleans M.D. On: 02/16/2020 11:52  No results found for this or any previous visit.   Assessment & Plan:    1. Nephrolithiasis -Continue flomax 0.4mg  daily  -RTC 1 year with renal US - Urinalysis, Routine w reflex microscopic   No follow-ups on file.  Wilkie Aye, MD  [facility] [place]

## 2020-12-27 IMAGING — CT CT ABD-PEL WO/W CM
3 of 8 series · 11 of 46 positions shown, 17 images · IV contrast (omnipaque)
Comparison: None.

CLINICAL DATA: Gross hematuria. Recurrent urinary tract infections.

EXAM:
CT ABDOMEN AND PELVIS WITHOUT AND WITH CONTRAST
TECHNIQUE: Multidetector CT imaging of the abdomen and pelvis was performed
following the standard protocol before and following the bolus
administration of intravenous contrast.
CONTRAST:  125mL OMNIPAQUE IOHEXOL 300 MG/ML  SOLN

[Series 3: axial pre · axial · non-contrast · 0.67mm/px · 1 of 99 slices shown]
[im 13/99  soft-tissue]
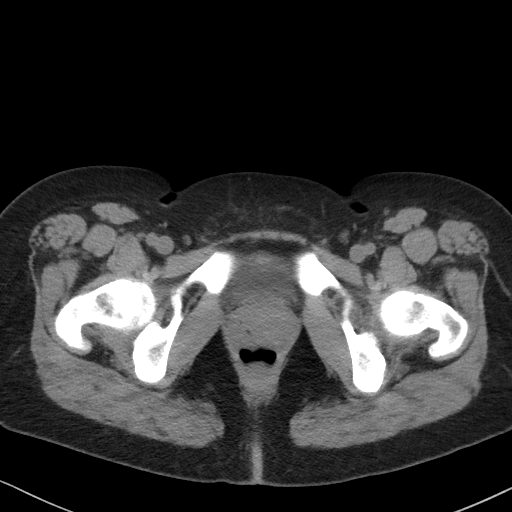

[Series 6: coronal pre · coronal · non-contrast · 0.68mm/px · 3 of 100 slices shown, 4 images]
[im 25/100  soft-tissue]
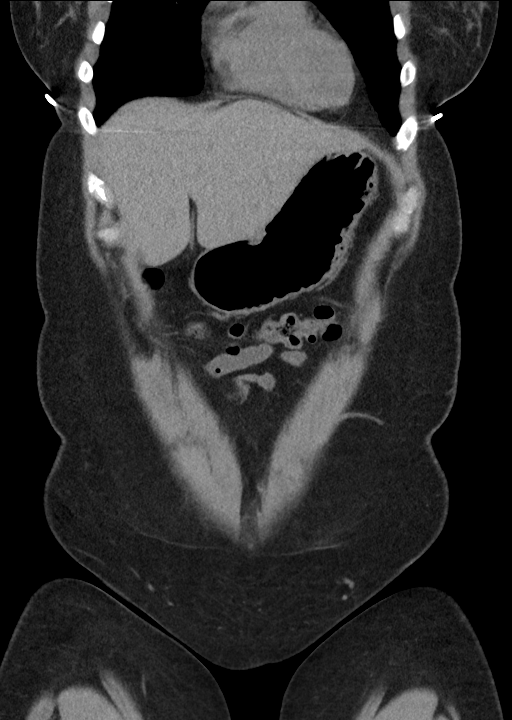
[im 50/100  soft-tissue]
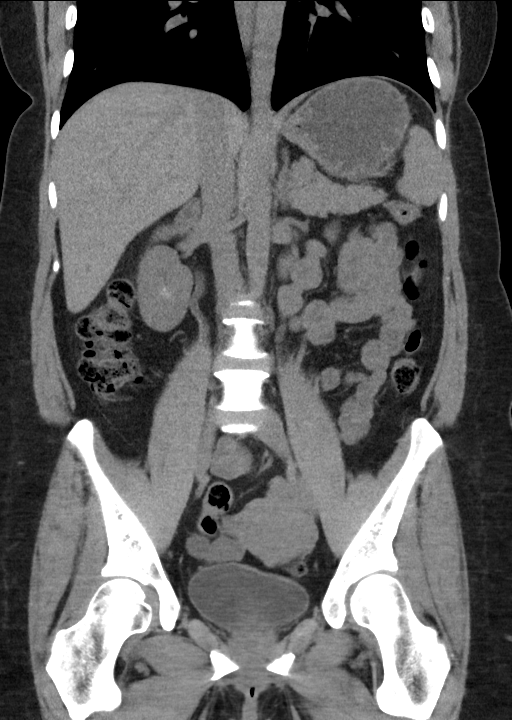
[im 50/100  bone]
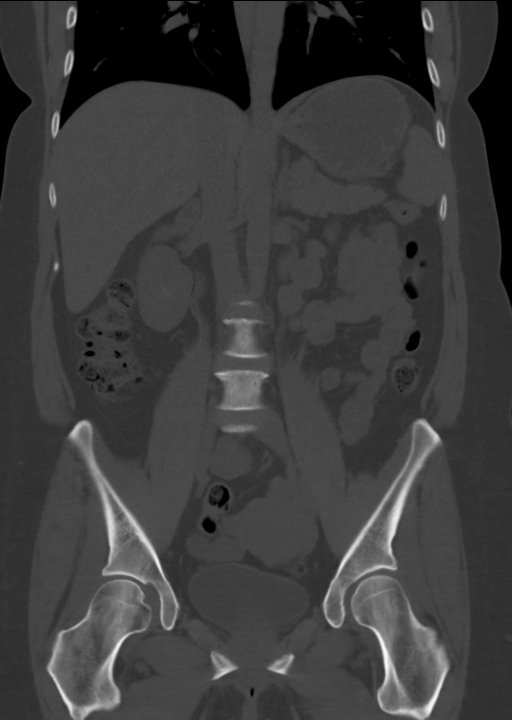
[im 75/100  soft-tissue]
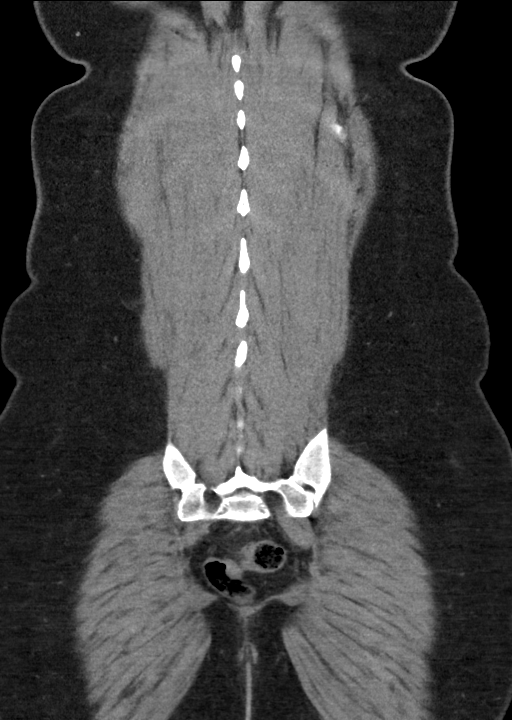

[Series 8: axial delay · axial · delayed · 0.80mm/px · z∈[-654,-264]mm · 7 of 104 slices shown, 12 images]
[im 13/104  soft-tissue]
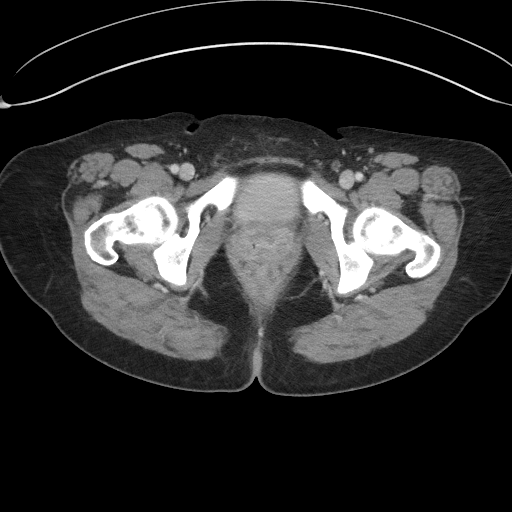
[im 13/104  bone]
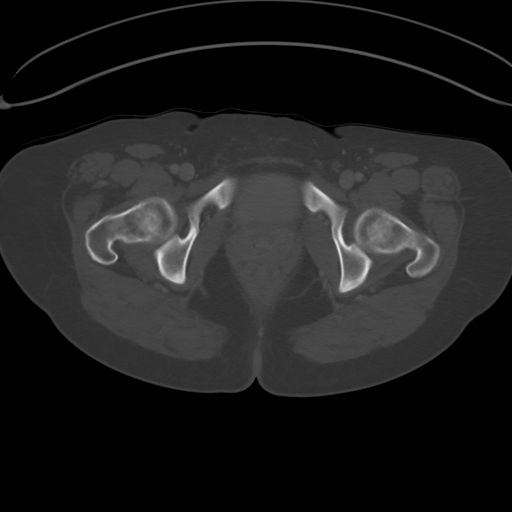
[im 26/104  soft-tissue]
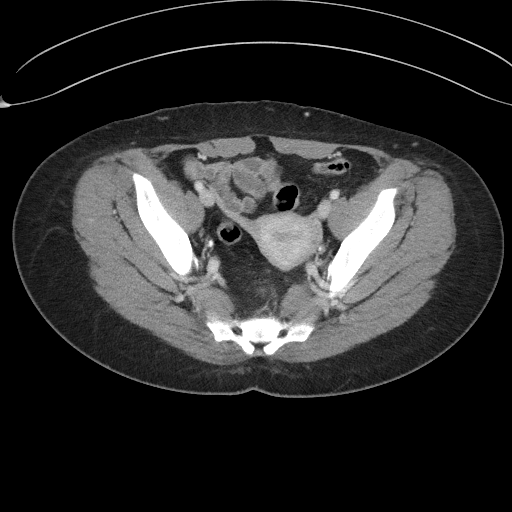
[im 39/104  soft-tissue]
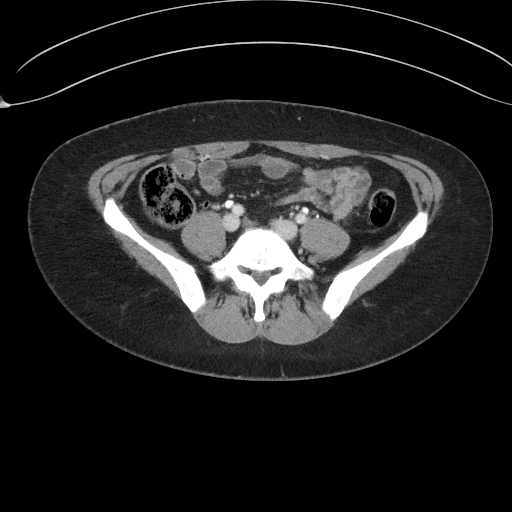
[im 52/104  soft-tissue]
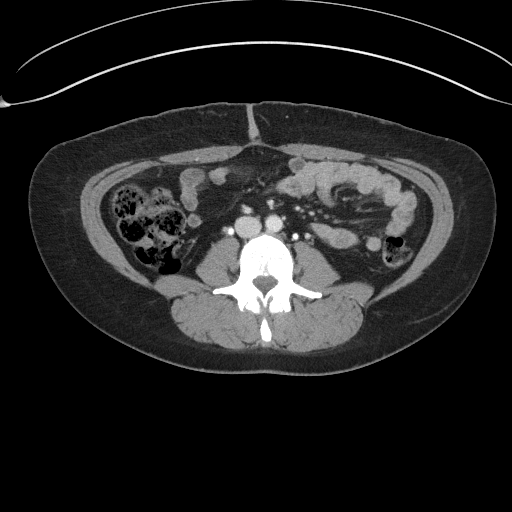
[im 52/104  lung]
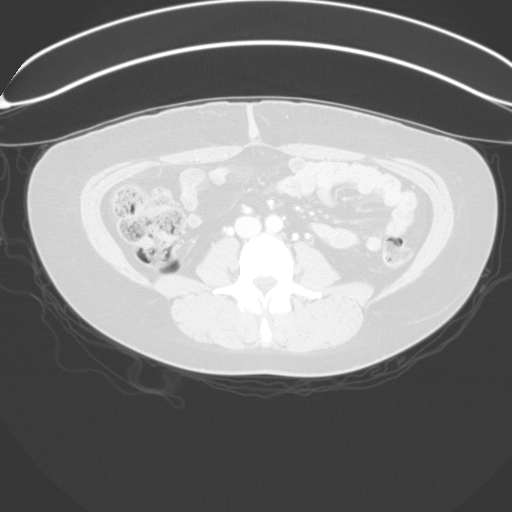
[im 65/104  soft-tissue]
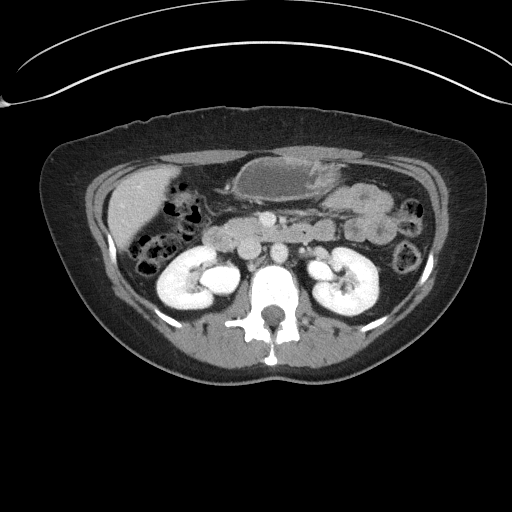
[im 65/104  lung]
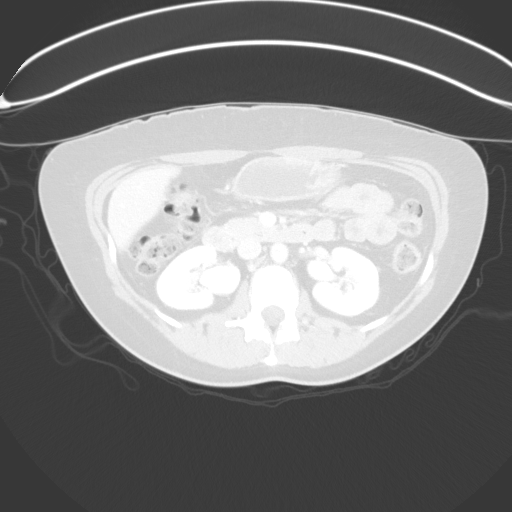
[im 78/104  soft-tissue]
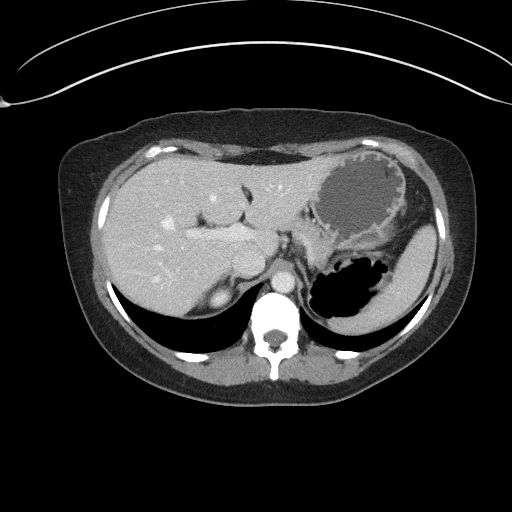
[im 78/104  lung]
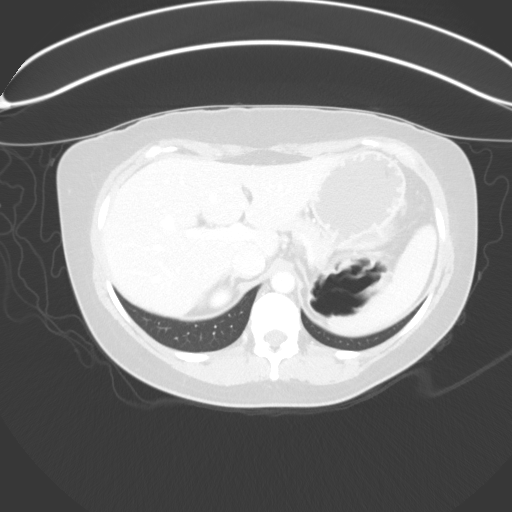
[im 91/104  soft-tissue]
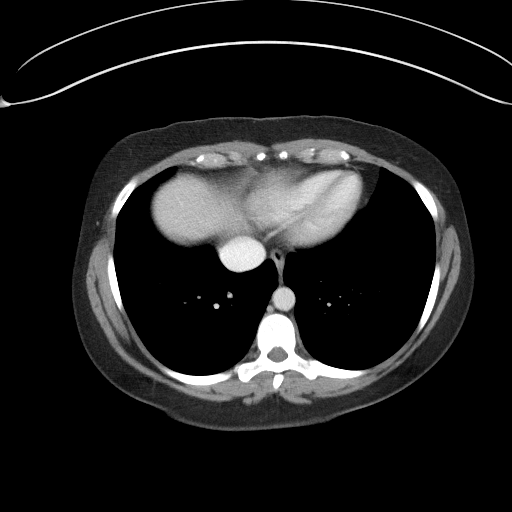
[im 91/104  lung]
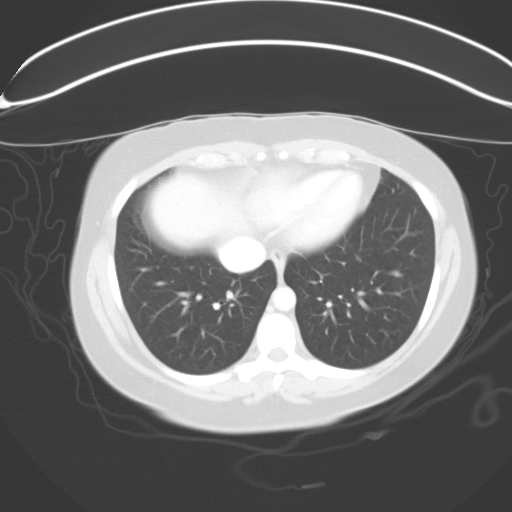

[11 of 46 positions shown; findings below may reference images not displayed]

FINDINGS: Lower Chest: No acute findings.

Hepatobiliary: No hepatic masses identified. Gallbladder is
unremarkable. No evidence of biliary ductal dilatation.

Pancreas:  No mass or inflammatory changes.

Spleen: Within normal limits in size and appearance.

Adrenals/Urinary Tract: No adrenal masses identified. Numerous tiny
calcifications are seen throughout the medullary pyramids both
kidneys, consistent with medullary nephrocalcinosis. No evidence
ureteral calculi or hydronephrosis. No renal masses identified. No
masses seen involving the ureters or bladder.

Stomach/Bowel: No evidence of obstruction, inflammatory process or
abnormal fluid collections. Normal appendix visualized.

Vascular/Lymphatic: No pathologically enlarged lymph nodes. No
abdominal aortic aneurysm.

Reproductive:  No mass or other significant abnormality.

Other:  None.

Musculoskeletal:  No suspicious bone lesions identified.
IMPRESSION: 1. Bilateral renal medullary nephrocalcinosis. No evidence of
ureteral calculi or hydronephrosis.
2. No radiographic evidence of urinary tract neoplasm.

## 2021-04-18 ENCOUNTER — Ambulatory Visit: Payer: 59 | Admitting: Urology

## 2021-08-03 IMAGING — US US RENAL
1 series · 14 of 25 positions shown · non-contrast
Comparison: Renal ultrasound November 18, 2019; CT abdomen and pelvis
February 15, 2020

CLINICAL DATA: Nephrolithiasis

EXAM:
RENAL / URINARY TRACT ULTRASOUND COMPLETE

[Series 1: us renal · 14 of 46 slices shown]
[im 1/46]
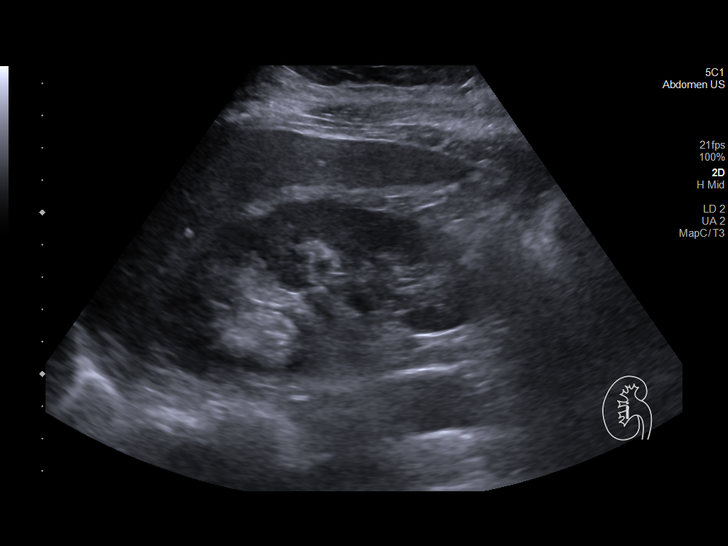
[im 4/46]
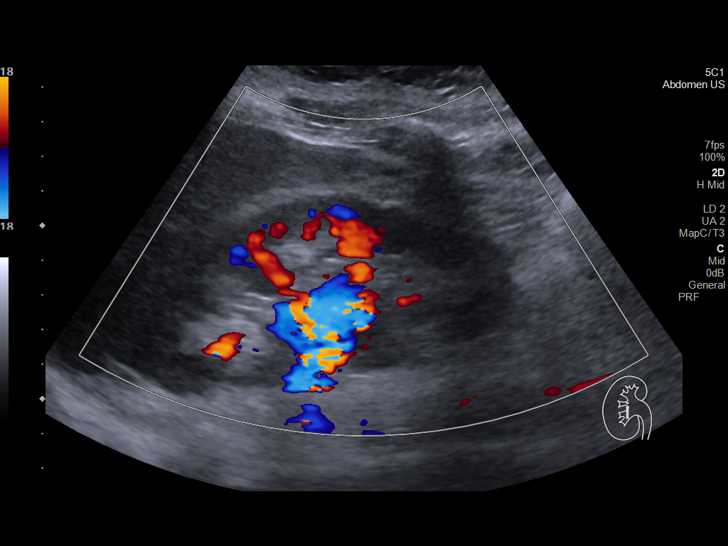
[im 8/46]
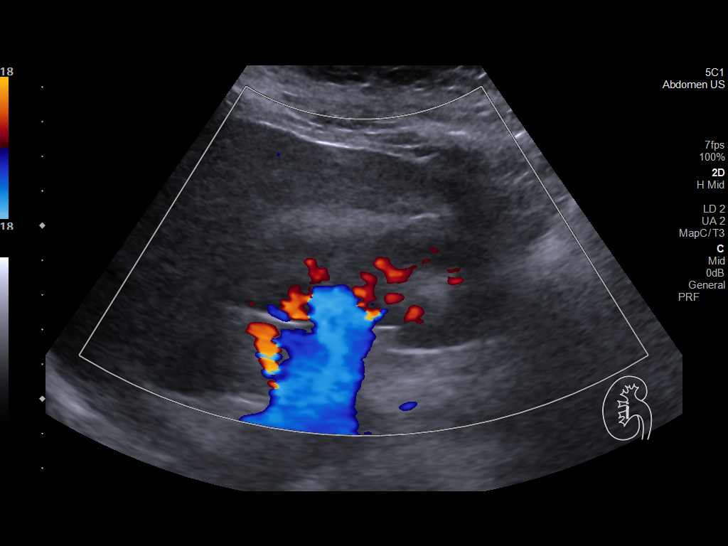
[im 12/46]
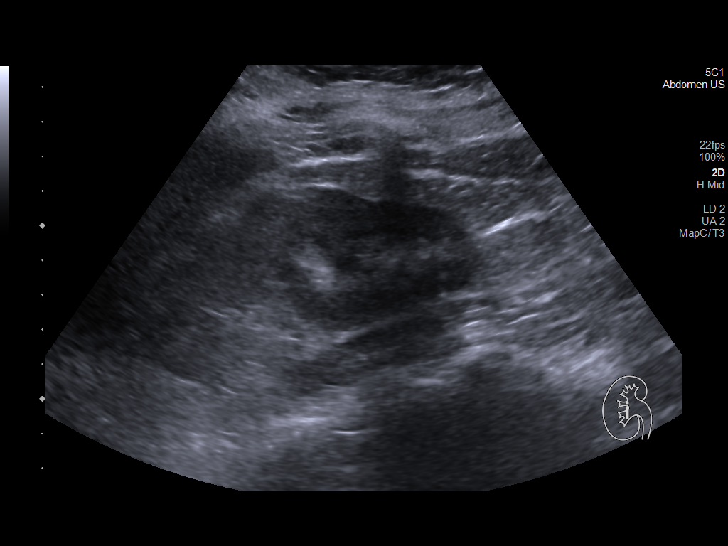
[im 16/46]
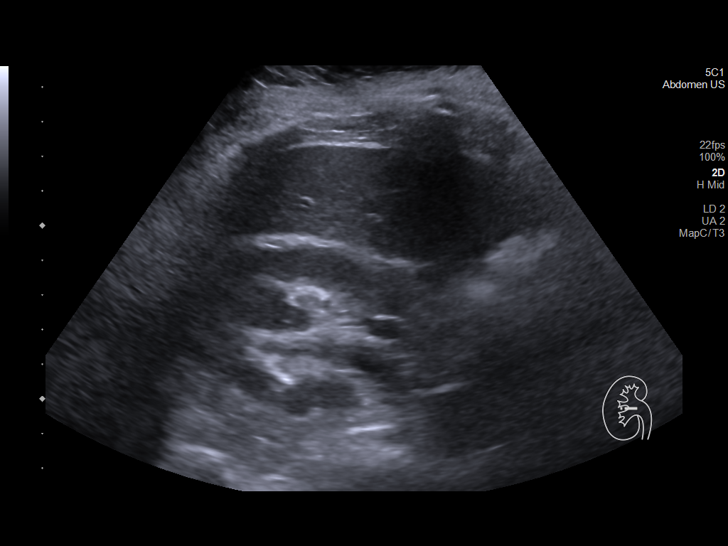
[im 17/46]
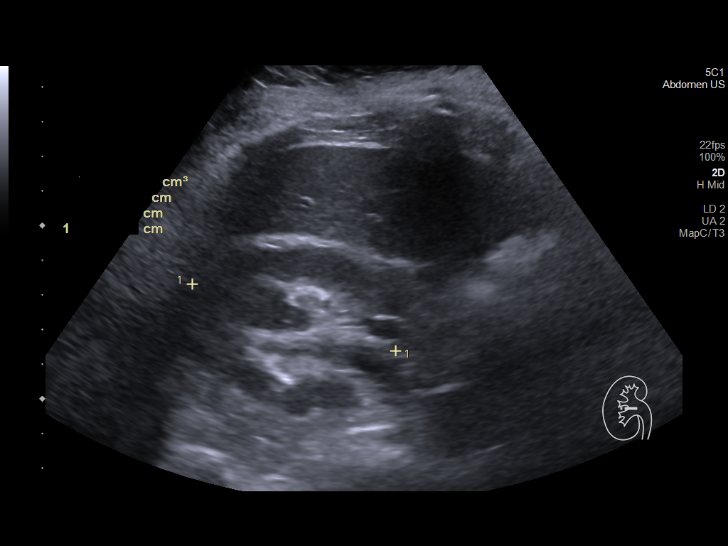
[im 21/46]
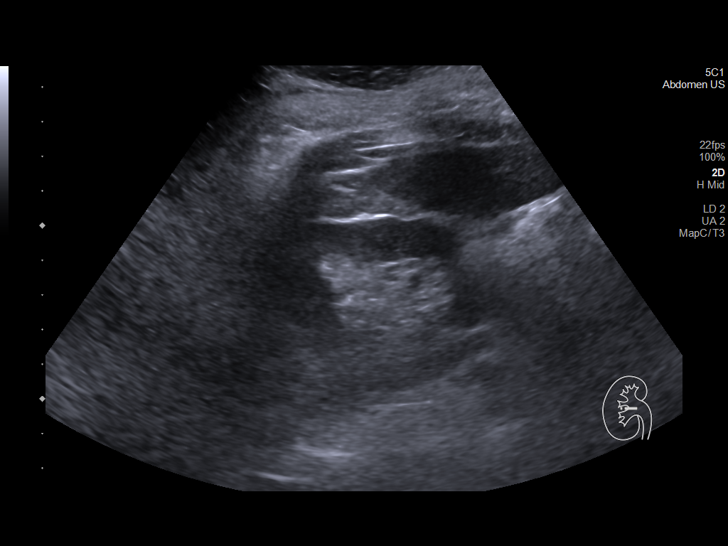
[im 25/46]
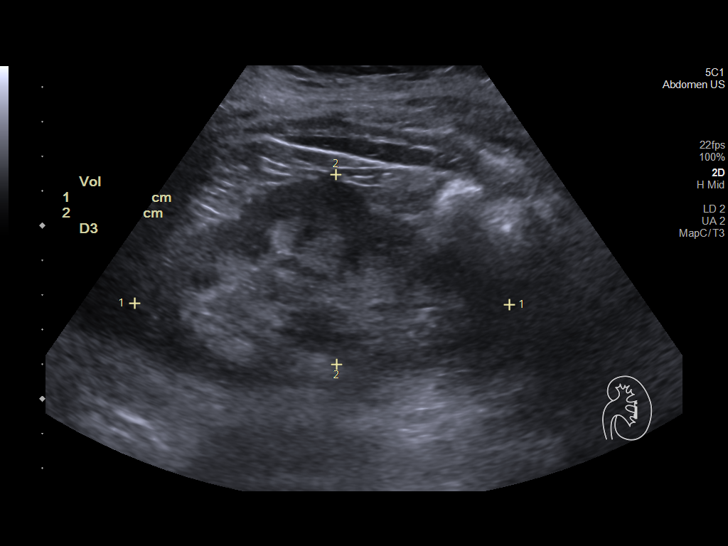
[im 29/46]
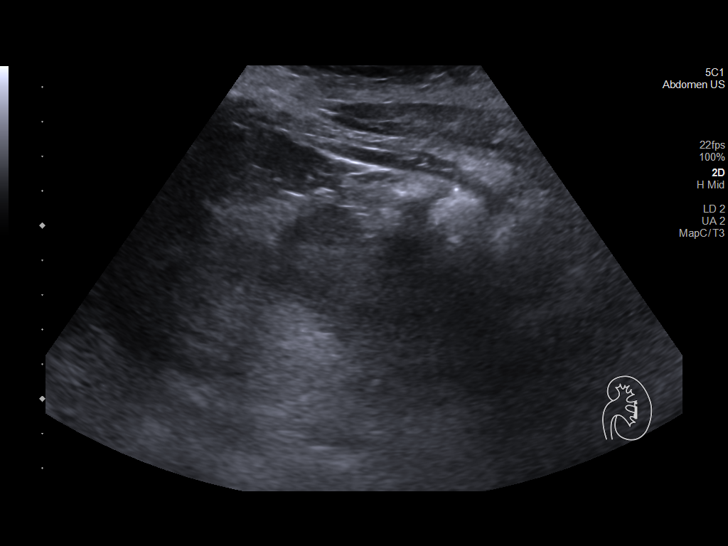
[im 31/46]
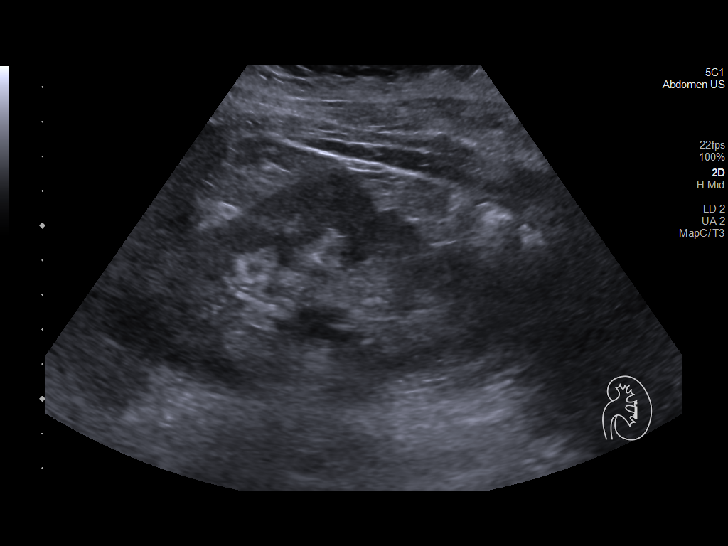
[im 34/46]
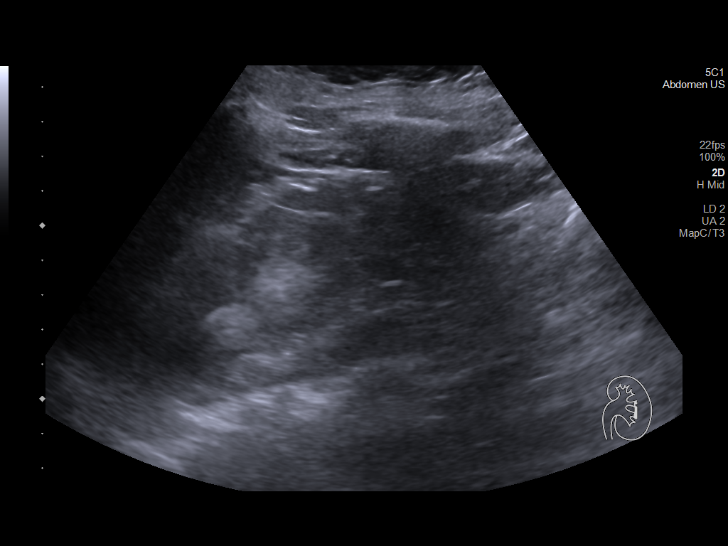
[im 38/46]
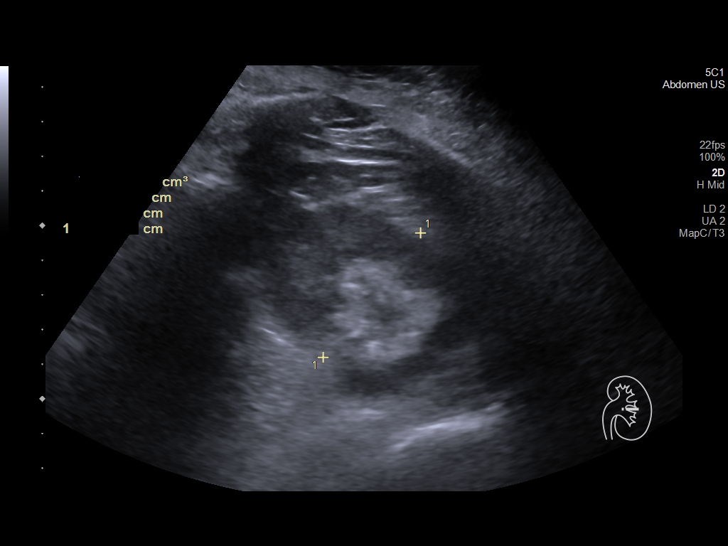
[im 42/46]
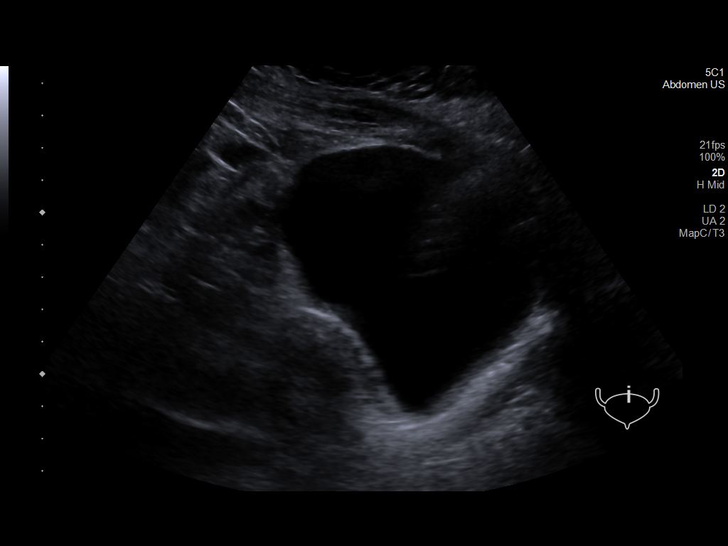
[im 46/46]
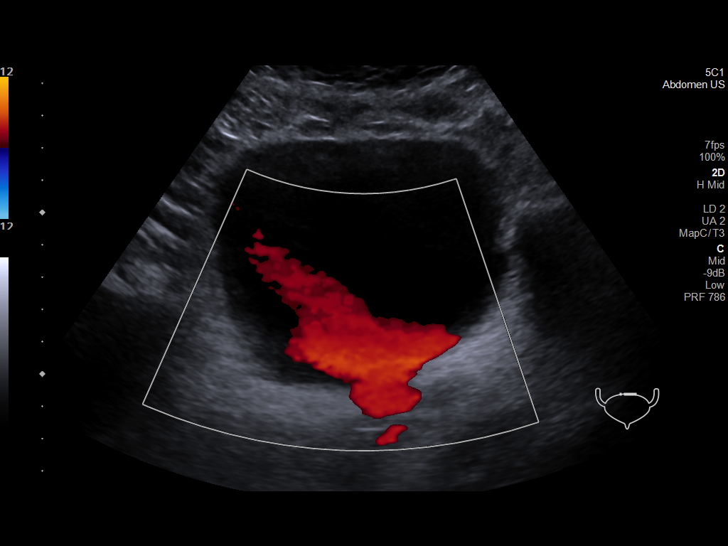

[14 of 25 positions shown; findings below may reference images not displayed]

FINDINGS: Right Kidney:

Renal measurements: 10.9 x 4.8 x 6.2 cm = volume: 170.3 mL.
Echogenicity within normal limits. No mass or hydronephrosis
visualized. Subtle apparent medullary nephrocalcinosis, better seen
on recent CT. No ureterectasis.

Left Kidney:

Renal measurements: 10.8 x 5.5 x 4.6 cm = volume: 141.4 mL.
Echogenicity within normal limits. No mass or hydronephrosis
visualized. Subtle apparent medullary nephrocalcinosis, better seen
on recent CT. No ureterectasis.

Bladder:

Appears normal for degree of bladder distention. Flow from each
distal ureter seen in the bladder.

Other:

None.
IMPRESSION: 1. Subtle changes of apparent medullary nephrocalcinosis, better
seen on prior CT.

2. Kidneys otherwise appear normal bilaterally. No lesion involving
urinary bladder.

## 2021-11-03 ENCOUNTER — Emergency Department (HOSPITAL_COMMUNITY)
Admission: EM | Admit: 2021-11-03 | Discharge: 2021-11-04 | Disposition: A | Discharge status: Home / Self Care | Payer: BC Managed Care – PPO | Attending: Emergency Medicine | Admitting: Emergency Medicine

## 2021-11-03 ENCOUNTER — Other Ambulatory Visit: Payer: Self-pay

## 2021-11-03 DIAGNOSIS — R8290 Unspecified abnormal findings in urine: Secondary | ICD-10-CM | POA: Diagnosis not present

## 2021-11-03 DIAGNOSIS — N200 Calculus of kidney: Secondary | ICD-10-CM | POA: Diagnosis not present

## 2021-11-03 DIAGNOSIS — R109 Unspecified abdominal pain: Secondary | ICD-10-CM | POA: Diagnosis present

## 2021-11-03 DIAGNOSIS — E876 Hypokalemia: Secondary | ICD-10-CM

## 2021-11-03 LAB — CBC WITH DIFFERENTIAL/PLATELET
Abs Immature Granulocytes: 0.03 10*3/uL (ref 0.00–0.07)
Basophils Absolute: 0 10*3/uL (ref 0.0–0.1)
Basophils Relative: 1 %
Eosinophils Absolute: 0.1 10*3/uL (ref 0.0–0.5)
Eosinophils Relative: 1 %
HCT: 38.4 % (ref 36.0–46.0)
Hemoglobin: 13.9 g/dL (ref 12.0–15.0)
Immature Granulocytes: 0 %
Lymphocytes Relative: 44 %
Lymphs Abs: 3.8 10*3/uL (ref 0.7–4.0)
MCH: 32 pg (ref 26.0–34.0)
MCHC: 36.2 g/dL — ABNORMAL HIGH (ref 30.0–36.0)
MCV: 88.5 fL (ref 80.0–100.0)
Monocytes Absolute: 0.8 10*3/uL (ref 0.1–1.0)
Monocytes Relative: 9 %
Neutro Abs: 4 10*3/uL (ref 1.7–7.7)
Neutrophils Relative %: 45 %
Platelets: 257 10*3/uL (ref 150–400)
RBC: 4.34 MIL/uL (ref 3.87–5.11)
RDW: 11.9 % (ref 11.5–15.5)
WBC: 8.7 10*3/uL (ref 4.0–10.5)
nRBC: 0 % (ref 0.0–0.2)

## 2021-11-03 LAB — I-STAT BETA HCG BLOOD, ED (MC, WL, AP ONLY): I-stat hCG, quantitative: <5 m[IU]/mL (ref <5)

## 2021-11-03 MED ORDER — HYDROMORPHONE HCL 1 MG/ML IJ SOLN
1.0000 mg | Freq: Once | INTRAMUSCULAR | Status: AC
  Administered 2021-11-03: 1 mg via INTRAVENOUS
  Filled 2021-11-03: qty 1

## 2021-11-03 MED ORDER — ONDANSETRON HCL 4 MG/2ML IJ SOLN
4.0000 mg | Freq: Once | INTRAMUSCULAR | Status: AC
  Administered 2021-11-03: 4 mg via INTRAVENOUS
  Filled 2021-11-03: qty 2

## 2021-11-03 NOTE — ED Triage Notes (Signed)
Pt c/o abd pain and back pain for the past hour.

## 2021-11-03 NOTE — ED Provider Triage Note (Signed)
°  Emergency Medicine Provider Triage Evaluation Note  MRN:  [id number]  Arrival date & time: 11/03/21    Medically screening exam initiated at 11:25 PM.   CC:   Abdominal Pain and Back Pain   HPI:  Kristie Davidson is a 46 y.o. year-old female presents to the ED with chief complaint of left flank pain.  Severe.  Sudden onset tonight.  History of kidney stones.  Denies successful treatment PTA. History provided by patient. Additional independent history provided by spouse, who states patient started after dinner tonight and that she hasn't been able to urinate. ROS:  -As included in HPI PE:   Vitals:   11/03/21 2311  BP: 125/71  Pulse: 80  Resp: 20  Temp: 98.2 F (36.8 C)  SpO2: 99%    Non-toxic appearing, but very uncomfortable No respiratory distress Writhing in pain MDM:  Based on signs and symptoms, kidney stone is highest on my differential, followed by pyelonephritis and ovarian torsion.  TOA and ectopic considered, but thought less likely given appearance. I've ordered labs and CT renal in triage to expedite lab/diagnostic workup.  Patient was informed that the remainder of the evaluation will be completed by another provider, this initial triage assessment does not replace that evaluation, and the importance of remaining in the ED until their evaluation is complete.    Roxy Horseman, PA-C 11/03/21 2328

## 2021-11-04 ENCOUNTER — Emergency Department (HOSPITAL_COMMUNITY): Payer: BC Managed Care – PPO

## 2021-11-04 LAB — COMPREHENSIVE METABOLIC PANEL
ALT: 19 U/L (ref 0–44)
AST: 24 U/L (ref 15–41)
Albumin: 4.1 g/dL (ref 3.5–5.0)
Alkaline Phosphatase: 62 U/L (ref 38–126)
Anion gap: 13 (ref 5–15)
BUN: 12 mg/dL (ref 6–20)
CO2: 20 mmol/L — ABNORMAL LOW (ref 22–32)
Calcium: 9.5 mg/dL (ref 8.9–10.3)
Chloride: 104 mmol/L (ref 98–111)
Creatinine, Ser: 0.8 mg/dL (ref 0.44–1.00)
GFR, Estimated: >60 mL/min (ref >60)
Glucose, Bld: 177 mg/dL — ABNORMAL HIGH (ref 70–99)
Potassium: 2.9 mmol/L — ABNORMAL LOW (ref 3.5–5.1)
Sodium: 137 mmol/L (ref 135–145)
Total Bilirubin: 0.8 mg/dL (ref 0.3–1.2)
Total Protein: 7 g/dL (ref 6.5–8.1)

## 2021-11-04 LAB — URINALYSIS, ROUTINE W REFLEX MICROSCOPIC
Bilirubin Urine: NEGATIVE
Glucose, UA: NEGATIVE mg/dL
Ketones, ur: NEGATIVE mg/dL
Leukocytes,Ua: NEGATIVE
Nitrite: POSITIVE — AB
Protein, ur: NEGATIVE mg/dL
Specific Gravity, Urine: 1.023 (ref 1.005–1.030)
pH: 5 (ref 5.0–8.0)

## 2021-11-04 LAB — LIPASE, BLOOD: Lipase: 39 U/L (ref 11–51)

## 2021-11-04 MED ORDER — HYDROCODONE-ACETAMINOPHEN 5-325 MG PO TABS
1.0000 | ORAL_TABLET | Freq: Four times a day (QID) | ORAL | 0 refills | Status: DC | PRN
Start: 2021-11-04 — End: 2023-10-08

## 2021-11-04 MED ORDER — KETOROLAC TROMETHAMINE 30 MG/ML IJ SOLN
30.0000 mg | Freq: Once | INTRAMUSCULAR | Status: AC
  Administered 2021-11-04: 30 mg via INTRAVENOUS
  Filled 2021-11-04: qty 1

## 2021-11-04 MED ORDER — CEPHALEXIN 250 MG PO CAPS
500.0000 mg | ORAL_CAPSULE | Freq: Once | ORAL | Status: AC
  Administered 2021-11-04: 500 mg via ORAL
  Filled 2021-11-04: qty 2

## 2021-11-04 MED ORDER — ONDANSETRON 4 MG PO TBDP: 4.0000 mg | ORAL_TABLET | Freq: Three times a day (TID) | ORAL | 0 refills | Status: DC | PRN

## 2021-11-04 MED ORDER — HYDROCODONE-ACETAMINOPHEN 5-325 MG PO TABS
1.0000 | ORAL_TABLET | Freq: Once | ORAL | Status: AC
  Administered 2021-11-04: 1 via ORAL
  Filled 2021-11-04: qty 1

## 2021-11-04 MED ORDER — CEPHALEXIN 500 MG PO CAPS: 500.0000 mg | ORAL_CAPSULE | Freq: Two times a day (BID) | ORAL | 0 refills | Status: DC

## 2021-11-04 MED ORDER — POTASSIUM CHLORIDE CRYS ER 20 MEQ PO TBCR
40.0000 meq | EXTENDED_RELEASE_TABLET | Freq: Once | ORAL | Status: AC
Start: 2021-11-04 — End: 2021-11-04
  Administered 2021-11-04: 40 meq via ORAL
  Filled 2021-11-04: qty 2

## 2021-11-04 NOTE — Discharge Instructions (Addendum)
Please strain your urine with the strainer provided.  Go to [facility] ER or return here for new or worsening symptoms.

## 2021-11-04 NOTE — ED Provider Notes (Signed)
[facility]EMER[facility]ENCY DEPT [facility] Emergency Department Provider Note MRN:  [id number]  Arrival date & time: 11/04/21     Chief Complaint   Abdominal Pain and Back Pain   History of Present Illness   Kristie Davidson is a 46 y.o. year-old female presents to the ED with chief complaint of sudden onset left flank pain that began this evening after dinner.  She reports history of kidney stones.  States that this feels similar.  States pain is severe.  Denies any successful treatments prior to arrival.  Additional independent history provided by spouse, who states pain started about 6 PM.  Review of Systems  Pertinent review of systems noted in HPI.    Physical Exam   Vitals:   11/04/21 0230 11/04/21 0245  BP: 99/62 108/66  Pulse: 80 74  Resp: 18 18  Temp:    SpO2: 100% 100%    CONSTITUTIONAL: Uncomfortable-appearing, NAD NEURO:  Alert and oriented x 3, CN 3-12 grossly intact EYES:  eyes equal and reactive ENT/NECK:  Supple, no stridor  CARDIO: Regular rate, regular rhythm, appears well-perfused  PULM:  No respiratory distress, clear to auscultation GI/GU:  non-distended, left CVA tenderness, no focal abdominal tenderness MSK/SPINE:  No gross deformities, no edema, moves all extremities  SKIN:  no rash, atraumatic   *Additional and/or pertinent findings included in MDM below  Diagnostic and Interventional Summary    EKG Interpretation  Date/Time:    Ventricular Rate:    PR Interval:    QRS Duration:   QT Interval:    QTC Calculation:   R Axis:     Text Interpretation:         Labs Reviewed  COMPREHENSIVE METABOLIC PANEL - Abnormal; Notable for the following components:      Result Value   Potassium 2.9 (*)    CO2 20 (*)    Glucose, Bld 177 (*)    All other components within normal limits  CBC WITH DIFFERENTIAL/PLATELET - Abnormal; Notable for the following components:   MCHC 36.2 (*)    All other components within normal limits  URINALYSIS, ROUTINE W  REFLEX MICROSCOPIC - Abnormal; Notable for the following components:   Color, Urine ORANGE (*)    Hgb urine dipstick MODERATE (*)    Nitrite POSITIVE (*)    Bacteria, UA RARE (*)    All other components within normal limits  LIPASE, BLOOD  I-STAT BETA HCG BLOOD, ED ([facility], [facility], [facility] ONLY)    CT Renal Stone Study  Final Result      Medications  potassium chloride SA (KLOR-CON M) CR tablet 40 mEq (has no administration in time range)  cephALEXin (KEFLEX) capsule 500 mg (has no administration in time range)  HYDROcodone-acetaminophen (NORCO/VICODIN) 5-325 MG per tablet 1 tablet (has no administration in time range)  ondansetron (ZOFRAN) injection 4 mg (4 mg Intravenous Given 11/03/21 2324)  HYDROmorphone (DILAUDID) injection 1 mg (1 mg Intravenous Given 11/03/21 2323)  ketorolac (TORADOL) 30 MG/ML injection 30 mg (30 mg Intravenous Given 11/04/21 0138)     Procedures  /  Critical Care Procedures  ED Course and Medical Decision Making  I have reviewed the triage vital signs, the nursing notes, and pertinent available records from the EMR.  Complexity of Problems Addressed: High Complexity: Acute illness/injury posing a threat to life or bodily function, requiring emergent diagnostic workup, evaluation, and treatment as below.  ED Course:    After considering the following differential, kidney stone, ovarian torsion, ectopic pregnancy, appendicitis, I  ordered labs and CT renal.  I personally interpreted the labs which are notable for negative pregnancy test, no significant leukocytosis. I visualized the CT, which is notable for a kidney stone and agree with radiologist interpretation..  Urinalysis concerning for possible infection, will cover with keflex.  Patient is non-toxic.  Patient treated with intravenous Toradol, Dilaudid, and Zofran.  Social Determinants Affecting Care: No clinically significant social determinants affecting this chief complaint.    Consultants: No  consultations were needed in caring for this patient.   Treatment and Plan:  We will treat kidney stone pain with Vicodin, cover abnormal urinalysis with Keflex, and discharge home with close urology follow-up    I considered admission due to patient's initial presentation, but after considering the examination and diagnostic results, patient will not require admission and can be discharged with outpatient follow-up.    Final Clinical Impressions(s) / ED Diagnoses     ICD-10-CM   1. Kidney stone  N20.0     2. Abnormal urinalysis  R82.90     3. Hypokalemia  E87.6       ED Discharge Orders          Ordered    HYDROcodone-acetaminophen (NORCO/VICODIN) 5-325 MG tablet  Every 6 hours PRN        11/04/21 0359    cephALEXin (KEFLEX) 500 MG capsule  2 times daily        11/04/21 0359    ondansetron (ZOFRAN-ODT) 4 MG disintegrating tablet  Every 8 hours PRN        11/04/21 0359             Discharge Instructions Discussed with and Provided to Patient:     Discharge Instructions      Please strain your urine with the strainer provided.  Go to [facility] ER or return here for new or worsening symptoms.       Roxy Horseman, PA-C 11/04/21 2202    Shon Baton, MD 11/05/21 [phone]

## 2021-11-07 ENCOUNTER — Ambulatory Visit (INDEPENDENT_AMBULATORY_CARE_PROVIDER_SITE_OTHER): Payer: BC Managed Care – PPO | Admitting: Urology

## 2021-11-07 ENCOUNTER — Other Ambulatory Visit: Payer: Self-pay

## 2021-11-07 ENCOUNTER — Ambulatory Visit (HOSPITAL_COMMUNITY): Payer: BC Managed Care – PPO

## 2021-11-07 ENCOUNTER — Ambulatory Visit: Payer: BC Managed Care – PPO | Admitting: Physician Assistant

## 2021-11-07 ENCOUNTER — Encounter: Payer: Self-pay | Admitting: Urology

## 2021-11-07 VITALS — BP 116/76 | HR 85 | Ht 64.0 in | Wt 180.0 lb

## 2021-11-07 DIAGNOSIS — N200 Calculus of kidney: Secondary | ICD-10-CM

## 2021-11-07 DIAGNOSIS — N390 Urinary tract infection, site not specified: Secondary | ICD-10-CM | POA: Diagnosis not present

## 2021-11-07 LAB — MICROSCOPIC EXAMINATION: Renal Epithel, UA: NONE SEEN /hpf

## 2021-11-07 LAB — URINALYSIS, ROUTINE W REFLEX MICROSCOPIC
Bilirubin, UA: NEGATIVE
Glucose, UA: NEGATIVE
Ketones, UA: NEGATIVE
Leukocytes,UA: NEGATIVE
Nitrite, UA: POSITIVE — AB
Protein,UA: NEGATIVE
Specific Gravity, UA: 1.015 (ref 1.005–1.030)
Urobilinogen, Ur: 2 mg/dL — ABNORMAL HIGH (ref 0.2–1.0)
pH, UA: 7 (ref 5.0–7.5)

## 2021-11-07 MED ORDER — TAMSULOSIN HCL 0.4 MG PO CAPS: 0.4000 mg | ORAL_CAPSULE | Freq: Every day | ORAL | 3 refills | Status: DC

## 2021-11-07 MED ORDER — ONDANSETRON 4 MG PO TBDP: 4.0000 mg | ORAL_TABLET | Freq: Three times a day (TID) | ORAL | 0 refills | Status: DC | PRN

## 2021-11-07 NOTE — Patient Instructions (Signed)
Dietary Guidelines to Help Prevent Kidney Stones Kidney stones are deposits of minerals and salts that form inside your kidneys. Your risk of developing kidney stones may be greater depending on your diet, your lifestyle, the medicines you take, and whether you have certain medical conditions. Most people can lower their chances of developing kidney stones by following the instructions below. Your dietitian may give you more specific instructions depending on your overall health and the type of kidney stones you tend to develop. What are tips for following this plan? Reading food labels  Choose foods with "no salt added" or "low-salt" labels. Limit your salt (sodium) intake to less than 1,500 mg a day. Choose foods with calcium for each meal and snack. Try to eat about 300 mg of calcium at each meal. Foods that contain 200-500 mg of calcium a serving include: 8 oz (237 mL) of milk, calcium-fortifiednon-dairy milk, and calcium-fortifiedfruit juice. Calcium-fortified means that calcium has been added to these drinks. 8 oz (237 mL) of kefir, yogurt, and soy yogurt. 4 oz (114 g) of tofu. 1 oz (28 g) of cheese. 1 cup (150 g) of dried figs. 1 cup (91 g) of cooked broccoli. One 3 oz (85 g) can of sardines or mackerel. Most people need 1,000-1,500 mg of calcium a day. Talk to your dietitian about how much calcium is recommended for you. Shopping Buy plenty of fresh fruits and vegetables. Most people do not need to avoid fruits and vegetables, even if these foods contain nutrients that may contribute to kidney stones. When shopping for convenience foods, choose: Whole pieces of fruit. Pre-made salads with dressing on the side. Low-fat fruit and yogurt smoothies. Avoid buying frozen meals or prepared deli foods. These can be high in sodium. Look for foods with live cultures, such as yogurt and kefir. Choose high-fiber grains, such as whole-wheat breads, oat bran, and wheat cereals. Cooking Do not add  salt to food when cooking. Place a salt shaker on the table and allow each person to add his or her own salt to taste. Use vegetable protein, such as beans, textured vegetable protein (TVP), or tofu, instead of meat in pasta, casseroles, and soups. Meal planning Eat less salt, if told by your dietitian. To do this: Avoid eating processed or pre-made food. Avoid eating fast food. Eat less animal protein, including cheese, meat, poultry, or fish, if told by your dietitian. To do this: Limit the number of times you have meat, poultry, fish, or cheese each week. Eat a diet free of meat at least 2 days a week. Eat only one serving each day of meat, poultry, fish, or seafood. When you prepare animal protein, cut pieces into small portion sizes. For most meat and fish, one serving is about the size of the palm of your hand. Eat at least five servings of fresh fruits and vegetables each day. To do this: Keep fruits and vegetables on hand for snacks. Eat one piece of fruit or a handful of berries with breakfast. Have a salad and fruit at lunch. Have two kinds of vegetables at dinner. Limit foods that are high in a substance called oxalate. These include: Spinach (cooked), rhubarb, beets, sweet potatoes, and Swiss chard. Peanuts. Potato chips, french fries, and baked potatoes with skin on. Nuts and nut products. Chocolate. If you regularly take a diuretic medicine, make sure to eat at least 1 or 2 servings of fruits or vegetables that are high in potassium each day. These include: Avocado. Banana. Orange, prune,   carrot, or tomato juice. Baked potato. Cabbage. Beans and split peas. Lifestyle  Drink enough fluid to keep your urine pale yellow. This is the most important thing you can do. Spread your fluid intake throughout the day. If you drink alcohol: Limit how much you use to: 0-1 drink a day for women who are not pregnant. 0-2 drinks a day for men. Be aware of how much alcohol is in your  drink. In the [place] one drink equals one 12 oz bottle of beer (355 mL), one 5 oz glass of wine (148 mL), or one 1 oz glass of hard liquor (44 mL). Lose weight if told by your health care provider. Work with your dietitian to find an eating plan and weight loss strategies that work best for you. General information Talk to your health care provider and dietitian about taking daily supplements. You may be told the following depending on your health and the cause of your kidney stones: Not to take supplements with vitamin C. To take a calcium supplement. To take a daily probiotic supplement. To take other supplements such as magnesium, fish oil, or vitamin B6. Take over-the-counter and prescription medicines only as told by your health care provider. These include supplements. What foods should I limit? Limit your intake of the following foods, or eat them as told by your dietitian. Vegetables Spinach. Rhubarb. Beets. Canned vegetables. Pickles. Olives. Baked potatoes with skin. Grains Wheat bran. Baked goods. Salted crackers. Cereals high in sugar. Meats and other proteins Nuts. Nut butters. Large portions of meat, poultry, or fish. Salted, precooked, or cured meats, such as sausages, meat loaves, and hot dogs. Dairy Cheese. Beverages Regular soft drinks. Regular vegetable juice. Seasonings and condiments Seasoning blends with salt. Salad dressings. Soy sauce. Ketchup. Barbecue sauce. Other foods Canned soups. Canned pasta sauce. Casseroles. Pizza. Lasagna. Frozen meals. Potato chips. French fries. The items listed above may not be a complete list of foods and beverages you should limit. Contact a dietitian for more information. What foods should I avoid? Talk to your dietitian about specific foods you should avoid based on the type of kidney stones you have and your overall health. Fruits Grapefruit. The item listed above may not be a complete list of foods and beverages you should  avoid. Contact a dietitian for more information. Summary Kidney stones are deposits of minerals and salts that form inside your kidneys. You can lower your risk of kidney stones by making changes to your diet. The most important thing you can do is drink enough fluid. Drink enough fluid to keep your urine pale yellow. Talk to your dietitian about how much calcium you should have each day, and eat less salt and animal protein as told by your dietitian. This information is not intended to replace advice given to you by your health care provider. Make sure you discuss any questions you have with your health care provider. Document Revised: 08/26/2019 Document Reviewed: 08/26/2019 Elsevier Patient Education  2022 [employer]  

## 2021-11-07 NOTE — Progress Notes (Signed)
11/07/2021 4:02 PM   Kristie Davidson 02-07-1976 [id number]  Referring provider: Practice, [facility] [place],  [place] [address]  nephrolithiasis   HPI: Kristie Davidson is a 46yo here for evaluation of nephrolithiasis. Starting Sunday she developed severe left flank pain which was sharp, intermittent, severe and nonraditing. She presented to the ER and underwent CT which showed a 57mm left UVJ calculus. Her pain is currently mild. No significant LUTS. No hematuria. No nausea or vomiting.    PMH: Past Medical History:  Diagnosis Date   Acid reflux    Anxiety    Depression    High cholesterol     Surgical History: Past Surgical History:  Procedure Laterality Date   BREAST REDUCTION SURGERY     CESAREAN SECTION     MOUTH SURGERY  2000   NOSE SURGERY      Home Medications:  Allergies as of 11/07/2021       Reactions   Sumatriptan    Nausea,cold sweats,tremors,chills        Medication List        Accurate as of November 07, 2021  4:02 PM. If you have any questions, ask your nurse or doctor.          amphetamine-dextroamphetamine 20 MG tablet Commonly known as: ADDERALL Take 20 mg by mouth 2 (two) times daily.   AZO-STANDARD PO Take 2 tablets by mouth every 6 (six) hours as needed (UTI symptoms).   BIOTIN PO Take 1 capsule by mouth daily.   cephALEXin 500 MG capsule Commonly known as: KEFLEX Take 1 capsule (500 mg total) by mouth 2 (two) times daily.   cetirizine 10 MG tablet Commonly known as: ZYRTEC Take 10 mg by mouth daily.   ferrous sulfate 325 (65 FE) MG tablet Take 325 mg by mouth daily with breakfast.   HYDROcodone-acetaminophen 5-325 MG tablet Commonly known as: NORCO/VICODIN Take 1-2 tablets by mouth every 6 (six) hours as needed.   LORazepam 1 MG tablet Commonly known as: ATIVAN Take 1 mg by mouth as needed for anxiety.   MAGNESIUM PO Take 1 tablet by mouth daily.   mirabegron ER 25 MG Tb24 tablet Commonly known as:  MYRBETRIQ Take 1 tablet (25 mg total) by mouth daily.   montelukast 10 MG tablet Commonly known as: SINGULAIR Take 10 mg by mouth daily.   multivitamin tablet Take 1 tablet by mouth daily.   naproxen sodium 220 MG tablet Commonly known as: ALEVE Take 220 mg by mouth daily as needed (pain/headache).   omeprazole 20 MG capsule Commonly known as: PRILOSEC Take 20 mg by mouth daily.   ondansetron 4 MG disintegrating tablet Commonly known as: ZOFRAN-ODT Take 1 tablet (4 mg total) by mouth every 8 (eight) hours as needed for nausea or vomiting.   simvastatin 20 MG tablet Commonly known as: ZOCOR Take 20 mg by mouth daily.   tamsulosin 0.4 MG Caps capsule Commonly known as: FLOMAX Take 1 capsule (0.4 mg total) by mouth daily.        Allergies:  Allergies  Allergen Reactions   Sumatriptan     Nausea,cold sweats,tremors,chills    Family History: Family History  Problem Relation Age of Onset   Kidney Stones Mother    Kidney Stones Sister     Social History:  reports that she has never smoked. She has never used smokeless tobacco. No history on file for alcohol use and drug use.  ROS: All other review of systems were reviewed  and are negative except what is noted above in HPI  Physical Exam: BP 116/76    Pulse 85    Ht 5\' 4"  (1.626 m)    Wt 180 lb (81.6 kg)    BMI 30.90 kg/m   Constitutional:  Alert and oriented, No acute distress. HEENT: [place] AT, moist mucus membranes.  Trachea midline, no masses. Cardiovascular: No clubbing, cyanosis, or edema. Respiratory: Normal respiratory effort, no increased work of breathing. GI: Abdomen is soft, nontender, nondistended, no abdominal masses GU: No CVA tenderness.  Lymph: No cervical or inguinal lymphadenopathy. Skin: No rashes, bruises or suspicious lesions. Neurologic: Grossly intact, no focal deficits, moving all 4 extremities. Psychiatric: Normal mood and affect.  Laboratory Data: Lab Results  Component Value Date    WBC 8.7 11/03/2021   HGB 13.9 11/03/2021   HCT 38.4 11/03/2021   MCV 88.5 11/03/2021   PLT 257 11/03/2021    Lab Results  Component Value Date   CREATININE 0.80 11/03/2021    No results found for: PSA  No results found for: TESTOSTERONE  No results found for: HGBA1C  Urinalysis    Component Value Date/Time   COLORURINE ORANGE (A) 11/04/2021 0312   APPEARANCEUR CLEAR 11/04/2021 0312   APPEARANCEUR Clear 11/15/2020 1533   LABSPEC 1.023 11/04/2021 0312   PHURINE 5.0 11/04/2021 0312   GLUCOSEU NEGATIVE 11/04/2021 0312   HGBUR MODERATE (A) 11/04/2021 0312   BILIRUBINUR NEGATIVE 11/04/2021 0312   BILIRUBINUR Negative 11/15/2020 1533   KETONESUR NEGATIVE 11/04/2021 0312   PROTEINUR NEGATIVE 11/04/2021 0312   UROBILINOGEN 0.2 01/12/2020 1552   NITRITE POSITIVE (A) 11/04/2021 0312   LEUKOCYTESUR NEGATIVE 11/04/2021 0312    Lab Results  Component Value Date   LABMICR Comment 11/15/2020   BACTERIA RARE (A) 11/04/2021    Pertinent Imaging: CT Stone study 11/04/2021: Images reviewed and discussed with the patient No results found for this or any previous visit.  No results found for this or any previous visit.  No results found for this or any previous visit.  No results found for this or any previous visit.  Results for orders placed during the hospital encounter of 09/21/20  Ultrasound renal complete  Narrative CLINICAL DATA:  Nephrolithiasis  EXAM: RENAL / URINARY TRACT ULTRASOUND COMPLETE  COMPARISON:  Renal ultrasound November 18, 2019; CT abdomen and pelvis February 15, 2020  FINDINGS: Right Kidney:  Renal measurements: 10.9 x 4.8 x 6.2 cm = volume: 170.3 mL. Echogenicity within normal limits. No mass or hydronephrosis visualized. Subtle apparent medullary nephrocalcinosis, better seen on recent CT. No ureterectasis.  Left Kidney:  Renal measurements: 10.8 x 5.5 x 4.6 cm = volume: 141.4 mL. Echogenicity within normal limits. No mass or  hydronephrosis visualized. Subtle apparent medullary nephrocalcinosis, better seen on recent CT. No ureterectasis.  Bladder:  Appears normal for degree of bladder distention. Flow from each distal ureter seen in the bladder.  Other:  None.  IMPRESSION: 1. Subtle changes of apparent medullary nephrocalcinosis, better seen on prior CT.  2. Kidneys otherwise appear normal bilaterally. No lesion involving urinary bladder.   Electronically Signed By: Lowella Grip III M.D. On: 09/21/2020 14:09  No results found for this or any previous visit.  Results for orders placed during the hospital encounter of 02/15/20  CT HEMATURIA WORKUP  Narrative CLINICAL DATA:  Gross hematuria. Recurrent urinary tract infections.  EXAM: CT ABDOMEN AND PELVIS WITHOUT AND WITH CONTRAST  TECHNIQUE: Multidetector CT imaging of the abdomen and pelvis was performed following the standard  protocol before and following the bolus administration of intravenous contrast.  CONTRAST:  OMNIPAQUE IOHEXOL 300 MG/ML  SOLN  COMPARISON:  None.  FINDINGS: Lower Chest: No acute findings.  Hepatobiliary: No hepatic masses identified. Gallbladder is unremarkable. No evidence of biliary ductal dilatation.  Pancreas:  No mass or inflammatory changes.  Spleen: Within normal limits in size and appearance.  Adrenals/Urinary Tract: No adrenal masses identified. Numerous tiny calcifications are seen throughout the medullary pyramids both kidneys, consistent with medullary nephrocalcinosis. No evidence ureteral calculi or hydronephrosis. No renal masses identified. No masses seen involving the ureters or bladder.  Stomach/Bowel: No evidence of obstruction, inflammatory process or abnormal fluid collections. Normal appendix visualized.  Vascular/Lymphatic: No pathologically enlarged lymph nodes. No abdominal aortic aneurysm.  Reproductive:  No mass or other significant abnormality.  Other:   None.  Musculoskeletal:  No suspicious bone lesions identified.  IMPRESSION: 1. Bilateral renal medullary nephrocalcinosis. No evidence of ureteral calculi or hydronephrosis. 2. No radiographic evidence of urinary tract neoplasm.   Electronically Signed By: Danae Orleans M.D. On: 02/16/2020 11:52  Results for orders placed during the hospital encounter of 11/03/21  CT Renal Stone Study  Narrative CLINICAL DATA:  Flank pain, kidney stone suspected  EXAM: CT ABDOMEN AND PELVIS WITHOUT CONTRAST  TECHNIQUE: Multidetector CT imaging of the abdomen and pelvis was performed following the standard protocol without IV contrast.  RADIATION DOSE REDUCTION: This exam was performed according to the departmental dose-optimization program which includes automated exposure control, adjustment of the mA and/or kV according to patient size and/or use of iterative reconstruction technique.  COMPARISON:  04/06/2021.  FINDINGS: Lower chest: Mild dependent atelectasis is present at the lung bases.  Hepatobiliary: No focal liver abnormality is seen. No gallstones, gallbladder wall thickening, or biliary dilatation.  Pancreas: Unremarkable. No pancreatic ductal dilatation or surrounding inflammatory changes.  Spleen: Normal in size without focal abnormality.  Adrenals/Urinary Tract: The adrenal glands are within normal limits. Renal calculi are present bilaterally in the renal pyramids are hyperdense. There is mild obstructive uropathy on the left with a 5 mm calculus in at the ureterovesicular junction on the left. No obstructive uropathy on the right. The bladder is otherwise within normal limits.  Stomach/Bowel: Ingested debris is present in the stomach. No bowel obstruction, free air, or pneumatosis. A normal appendix is present in the right lower quadrant. No focal bowel wall thickening.  Vascular/Lymphatic: No significant vascular findings are present. No enlarged abdominal or  pelvic lymph nodes.  Reproductive: Uterus and bilateral adnexa are unremarkable.  Other: No free fluid. A small fat containing umbilical hernia is noted.  Musculoskeletal: Mild degenerative changes are noted in the thoracolumbar spine. No acute osseous abnormality.  IMPRESSION: 1. Mild obstructive uropathy on the left with a 5 mm calculus at the UVJ on the left. 2. Bilateral nephrolithiasis. 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   Electronically Signed By: Thornell Sartorius M.D. On: 11/04/2021 02:10   Assessment & Plan:    1. nephrolithiasis -We discussed the management of kidney stones. These options include observation, ureteroscopy, shockwave lithotripsy (ESWL) and percutaneous nephrolithotomy (PCNL). We discussed which options are relevant to the patient's stone(s). We discussed the natural history of kidney stones as well as the complications of untreated stones and the impact on quality of life without treatment as well as with each of the above listed treatments. We also discussed the efficacy of each treatment in its ability to clear the stone burden. With any of these  management options I discussed the signs and symptoms of infection and the need for emergent treatment should these be experienced. For each option we discussed the ability of each procedure to clear the patient of their stone burden.   For observation I described the risks which include but are not limited to silent renal damage, life-threatening infection, need for emergent surgery, failure to pass stone and pain.   For ureteroscopy I described the risks which include bleeding, infection, damage to contiguous structures, positioning injury, ureteral stricture, ureteral avulsion, ureteral injury, need for prolonged ureteral stent, inability to perform ureteroscopy, need for an interval procedure, inability to clear stone burden, stent discomfort/pain, heart attack, stroke, pulmonary  embolus and the inherent risks with general anesthesia.   For shockwave lithotripsy I described the risks which include arrhythmia, kidney contusion, kidney hemorrhage, need for transfusion, pain, inability to adequately break up stone, inability to pass stone fragments, Steinstrasse, infection associated with obstructing stones, need for alternate surgical procedure, need for repeat shockwave lithotripsy, MI, CVA, PE and the inherent risks with anesthesia/conscious sedation.   For PCNL I described the risks including positioning injury, pneumothorax, hydrothorax, need for chest tube, inability to clear stone burden, renal laceration, arterial venous fistula or malformation, need for embolization of kidney, loss of kidney or renal function, need for repeat procedure, need for prolonged nephrostomy tube, ureteral avulsion, MI, CVA, PE and the inherent risks of general anesthesia.   - The patient would like to proceed with medical expulsive therapy. RTC 2 weeks with KUB - Urinalysis, Routine w reflex microscopic   No follow-ups on file.  Nicolette Bang, MD  [facility] [place]

## 2021-11-16 ENCOUNTER — Ambulatory Visit (HOSPITAL_COMMUNITY)
Admission: RE | Admit: 2021-11-16 | Discharge: 2021-11-16 | Disposition: A | Discharge status: Home / Self Care | Payer: BC Managed Care – PPO | Source: Ambulatory Visit | Attending: Urology | Admitting: Urology

## 2021-11-16 ENCOUNTER — Encounter: Payer: Self-pay | Admitting: Urology

## 2021-11-16 ENCOUNTER — Other Ambulatory Visit: Payer: Self-pay

## 2021-11-16 ENCOUNTER — Ambulatory Visit (INDEPENDENT_AMBULATORY_CARE_PROVIDER_SITE_OTHER): Payer: BC Managed Care – PPO | Admitting: Urology

## 2021-11-16 VITALS — BP 113/78 | HR 83

## 2021-11-16 DIAGNOSIS — N200 Calculus of kidney: Secondary | ICD-10-CM | POA: Insufficient documentation

## 2021-11-16 LAB — URINALYSIS, ROUTINE W REFLEX MICROSCOPIC
Bilirubin, UA: NEGATIVE
Glucose, UA: NEGATIVE
Ketones, UA: NEGATIVE
Leukocytes,UA: NEGATIVE
Nitrite, UA: NEGATIVE
Protein,UA: NEGATIVE
RBC, UA: NEGATIVE
Specific Gravity, UA: 1.025 (ref 1.005–1.030)
Urobilinogen, Ur: 0.2 mg/dL (ref 0.2–1.0)
pH, UA: 5.5 (ref 5.0–7.5)

## 2021-11-16 NOTE — Patient Instructions (Signed)
Dietary Guidelines to Help Prevent Kidney Stones Kidney stones are deposits of minerals and salts that form inside your kidneys. Your risk of developing kidney stones may be greater depending on your diet, your lifestyle, the medicines you take, and whether you have certain medical conditions. Most people can lower their chances of developing kidney stones by following the instructions below. Your dietitian may give you more specific instructions depending on your overall health and the type of kidney stones you tend to develop. What are tips for following this plan? Reading food labels  Choose foods with "no salt added" or "low-salt" labels. Limit your salt (sodium) intake to less than 1,500 mg a day. Choose foods with calcium for each meal and snack. Try to eat about 300 mg of calcium at each meal. Foods that contain 200-500 mg of calcium a serving include: 8 oz (237 mL) of milk, calcium-fortifiednon-dairy milk, and calcium-fortifiedfruit juice. Calcium-fortified means that calcium has been added to these drinks. 8 oz (237 mL) of kefir, yogurt, and soy yogurt. 4 oz (114 g) of tofu. 1 oz (28 g) of cheese. 1 cup (150 g) of dried figs. 1 cup (91 g) of cooked broccoli. One 3 oz (85 g) can of sardines or mackerel. Most people need 1,000-1,500 mg of calcium a day. Talk to your dietitian about how much calcium is recommended for you. Shopping Buy plenty of fresh fruits and vegetables. Most people do not need to avoid fruits and vegetables, even if these foods contain nutrients that may contribute to kidney stones. When shopping for convenience foods, choose: Whole pieces of fruit. Pre-made salads with dressing on the side. Low-fat fruit and yogurt smoothies. Avoid buying frozen meals or prepared deli foods. These can be high in sodium. Look for foods with live cultures, such as yogurt and kefir. Choose high-fiber grains, such as whole-wheat breads, oat bran, and wheat cereals. Cooking Do not add  salt to food when cooking. Place a salt shaker on the table and allow each person to add his or her own salt to taste. Use vegetable protein, such as beans, textured vegetable protein (TVP), or tofu, instead of meat in pasta, casseroles, and soups. Meal planning Eat less salt, if told by your dietitian. To do this: Avoid eating processed or pre-made food. Avoid eating fast food. Eat less animal protein, including cheese, meat, poultry, or fish, if told by your dietitian. To do this: Limit the number of times you have meat, poultry, fish, or cheese each week. Eat a diet free of meat at least 2 days a week. Eat only one serving each day of meat, poultry, fish, or seafood. When you prepare animal protein, cut pieces into small portion sizes. For most meat and fish, one serving is about the size of the palm of your hand. Eat at least five servings of fresh fruits and vegetables each day. To do this: Keep fruits and vegetables on hand for snacks. Eat one piece of fruit or a handful of berries with breakfast. Have a salad and fruit at lunch. Have two kinds of vegetables at dinner. Limit foods that are high in a substance called oxalate. These include: Spinach (cooked), rhubarb, beets, sweet potatoes, and Swiss chard. Peanuts. Potato chips, french fries, and baked potatoes with skin on. Nuts and nut products. Chocolate. If you regularly take a diuretic medicine, make sure to eat at least 1 or 2 servings of fruits or vegetables that are high in potassium each day. These include: Avocado. Banana. Orange, prune,   carrot, or tomato juice. Baked potato. Cabbage. Beans and split peas. Lifestyle  Drink enough fluid to keep your urine pale yellow. This is the most important thing you can do. Spread your fluid intake throughout the day. If you drink alcohol: Limit how much you use to: 0-1 drink a day for women who are not pregnant. 0-2 drinks a day for men. Be aware of how much alcohol is in your  drink. In the [place] one drink equals one 12 oz bottle of beer (355 mL), one 5 oz glass of wine (148 mL), or one 1 oz glass of hard liquor (44 mL). Lose weight if told by your health care provider. Work with your dietitian to find an eating plan and weight loss strategies that work best for you. General information Talk to your health care provider and dietitian about taking daily supplements. You may be told the following depending on your health and the cause of your kidney stones: Not to take supplements with vitamin C. To take a calcium supplement. To take a daily probiotic supplement. To take other supplements such as magnesium, fish oil, or vitamin B6. Take over-the-counter and prescription medicines only as told by your health care provider. These include supplements. What foods should I limit? Limit your intake of the following foods, or eat them as told by your dietitian. Vegetables Spinach. Rhubarb. Beets. Canned vegetables. Pickles. Olives. Baked potatoes with skin. Grains Wheat bran. Baked goods. Salted crackers. Cereals high in sugar. Meats and other proteins Nuts. Nut butters. Large portions of meat, poultry, or fish. Salted, precooked, or cured meats, such as sausages, meat loaves, and hot dogs. Dairy Cheese. Beverages Regular soft drinks. Regular vegetable juice. Seasonings and condiments Seasoning blends with salt. Salad dressings. Soy sauce. Ketchup. Barbecue sauce. Other foods Canned soups. Canned pasta sauce. Casseroles. Pizza. Lasagna. Frozen meals. Potato chips. French fries. The items listed above may not be a complete list of foods and beverages you should limit. Contact a dietitian for more information. What foods should I avoid? Talk to your dietitian about specific foods you should avoid based on the type of kidney stones you have and your overall health. Fruits Grapefruit. The item listed above may not be a complete list of foods and beverages you should  avoid. Contact a dietitian for more information. Summary Kidney stones are deposits of minerals and salts that form inside your kidneys. You can lower your risk of kidney stones by making changes to your diet. The most important thing you can do is drink enough fluid. Drink enough fluid to keep your urine pale yellow. Talk to your dietitian about how much calcium you should have each day, and eat less salt and animal protein as told by your dietitian. This information is not intended to replace advice given to you by your health care provider. Make sure you discuss any questions you have with your health care provider. Document Revised: 08/26/2019 Document Reviewed: 08/26/2019 Elsevier Patient Education  2022 [employer]  

## 2021-11-16 NOTE — Progress Notes (Signed)
11/16/2021 11:23 AM   Kristie Davidson November 21, 1975 [id number]  Referring provider: Practice, [facility] [address] [place],  [place] [address]  Followup nephrolithiasis   HPI: Kristie Davidson is a 45yo here followup for nephrolithiasis. She denies any flank pain for 1 week. No worsening LUTS. KUB from today shows no definitive ureteral calculus. No other complaints today.    PMH: Past Medical History:  Diagnosis Date   Acid reflux    Anxiety    Depression    High cholesterol     Surgical History: Past Surgical History:  Procedure Laterality Date   BREAST REDUCTION SURGERY     CESAREAN SECTION     MOUTH SURGERY  2000   NOSE SURGERY      Home Medications:  Allergies as of 11/16/2021       Reactions   Sumatriptan    Nausea,cold sweats,tremors,chills        Medication List        Accurate as of November 16, 2021 11:23 AM. If you have any questions, ask your nurse or doctor.          amphetamine-dextroamphetamine 20 MG tablet Commonly known as: ADDERALL Take 20 mg by mouth 2 (two) times daily.   AZO-STANDARD PO Take 2 tablets by mouth every 6 (six) hours as needed (UTI symptoms).   BIOTIN PO Take 1 capsule by mouth daily.   cephALEXin 500 MG capsule Commonly known as: KEFLEX Take 1 capsule (500 mg total) by mouth 2 (two) times daily.   cetirizine 10 MG tablet Commonly known as: ZYRTEC Take 10 mg by mouth daily.   ferrous sulfate 325 (65 FE) MG tablet Take 325 mg by mouth daily with breakfast.   HYDROcodone-acetaminophen 5-325 MG tablet Commonly known as: NORCO/VICODIN Take 1-2 tablets by mouth every 6 (six) hours as needed.   LORazepam 1 MG tablet Commonly known as: ATIVAN Take 1 mg by mouth as needed for anxiety.   MAGNESIUM PO Take 1 tablet by mouth daily.   mirabegron ER 25 MG Tb24 tablet Commonly known as: MYRBETRIQ Take 1 tablet (25 mg total) by mouth daily.   montelukast 10 MG tablet Commonly known as: SINGULAIR Take 10 mg by mouth  daily.   multivitamin tablet Take 1 tablet by mouth daily.   naproxen sodium 220 MG tablet Commonly known as: ALEVE Take 220 mg by mouth daily as needed (pain/headache).   omeprazole 20 MG capsule Commonly known as: PRILOSEC Take 20 mg by mouth daily.   ondansetron 4 MG disintegrating tablet Commonly known as: ZOFRAN-ODT Take 1 tablet (4 mg total) by mouth every 8 (eight) hours as needed for nausea or vomiting.   simvastatin 20 MG tablet Commonly known as: ZOCOR Take 20 mg by mouth daily.   tamsulosin 0.4 MG Caps capsule Commonly known as: FLOMAX Take 1 capsule (0.4 mg total) by mouth daily.        Allergies:  Allergies  Allergen Reactions   Sumatriptan     Nausea,cold sweats,tremors,chills    Family History: Family History  Problem Relation Age of Onset   Kidney Stones Mother    Kidney Stones Sister     Social History:  reports that she has never smoked. She has never used smokeless tobacco. No history on file for alcohol use and drug use.  ROS: All other review of systems were reviewed and are negative except what is noted above in HPI  Physical Exam: BP 113/78    Pulse 83  Constitutional:  Alert and oriented, No acute distress. HEENT: [place] AT, moist mucus membranes.  Trachea midline, no masses. Cardiovascular: No clubbing, cyanosis, or edema. Respiratory: Normal respiratory effort, no increased work of breathing. GI: Abdomen is soft, nontender, nondistended, no abdominal masses GU: No CVA tenderness.  Lymph: No cervical or inguinal lymphadenopathy. Skin: No rashes, bruises or suspicious lesions. Neurologic: Grossly intact, no focal deficits, moving all 4 extremities. Psychiatric: Normal mood and affect.  Laboratory Data: Lab Results  Component Value Date   WBC 8.7 11/03/2021   HGB 13.9 11/03/2021   HCT 38.4 11/03/2021   MCV 88.5 11/03/2021   PLT 257 11/03/2021    Lab Results  Component Value Date   CREATININE 0.80 11/03/2021    No results  found for: PSA  No results found for: TESTOSTERONE  No results found for: HGBA1C  Urinalysis    Component Value Date/Time   COLORURINE ORANGE (A) 11/04/2021 0312   APPEARANCEUR Clear 11/07/2021 1558   LABSPEC 1.023 11/04/2021 0312   PHURINE 5.0 11/04/2021 0312   GLUCOSEU Negative 11/07/2021 1558   HGBUR MODERATE (A) 11/04/2021 0312   BILIRUBINUR Negative 11/07/2021 1558   KETONESUR NEGATIVE 11/04/2021 0312   PROTEINUR Negative 11/07/2021 1558   PROTEINUR NEGATIVE 11/04/2021 0312   UROBILINOGEN 0.2 01/12/2020 1552   NITRITE Positive (A) 11/07/2021 1558   NITRITE POSITIVE (A) 11/04/2021 0312   LEUKOCYTESUR Negative 11/07/2021 1558   LEUKOCYTESUR NEGATIVE 11/04/2021 0312    Lab Results  Component Value Date   LABMICR See below: 11/07/2021   WBCUA 0-5 11/07/2021   LABEPIT 0-10 11/07/2021   BACTERIA Few (A) 11/07/2021    Pertinent Imaging: KUb today: Images reviewed and discussed with the patient  No results found for this or any previous visit.  No results found for this or any previous visit.  No results found for this or any previous visit.  No results found for this or any previous visit.  Results for orders placed during the hospital encounter of 09/21/20  Ultrasound renal complete  Narrative CLINICAL DATA:  Nephrolithiasis  EXAM: RENAL / URINARY TRACT ULTRASOUND COMPLETE  COMPARISON:  Renal ultrasound November 18, 2019; CT abdomen and pelvis February 15, 2020  FINDINGS: Right Kidney:  Renal measurements: 10.9 x 4.8 x 6.2 cm = volume: 170.3 mL. Echogenicity within normal limits. No mass or hydronephrosis visualized. Subtle apparent medullary nephrocalcinosis, better seen on recent CT. No ureterectasis.  Left Kidney:  Renal measurements: 10.8 x 5.5 x 4.6 cm = volume: 141.4 mL. Echogenicity within normal limits. No mass or hydronephrosis visualized. Subtle apparent medullary nephrocalcinosis, better seen on recent CT. No ureterectasis.  Bladder:  Appears  normal for degree of bladder distention. Flow from each distal ureter seen in the bladder.  Other:  None.  IMPRESSION: 1. Subtle changes of apparent medullary nephrocalcinosis, better seen on prior CT.  2. Kidneys otherwise appear normal bilaterally. No lesion involving urinary bladder.   Electronically Signed By: Bretta Bang III M.D. On: 09/21/2020 14:09  No results found for this or any previous visit.  Results for orders placed during the hospital encounter of 02/15/20  CT HEMATURIA WORKUP  Narrative CLINICAL DATA:  Gross hematuria. Recurrent urinary tract infections.  EXAM: CT ABDOMEN AND PELVIS WITHOUT AND WITH CONTRAST  TECHNIQUE: Multidetector CT imaging of the abdomen and pelvis was performed following the standard protocol before and following the bolus administration of intravenous contrast.  CONTRAST:  OMNIPAQUE IOHEXOL 300 MG/ML  SOLN  COMPARISON:  None.  FINDINGS: Lower  Chest: No acute findings.  Hepatobiliary: No hepatic masses identified. Gallbladder is unremarkable. No evidence of biliary ductal dilatation.  Pancreas:  No mass or inflammatory changes.  Spleen: Within normal limits in size and appearance.  Adrenals/Urinary Tract: No adrenal masses identified. Numerous tiny calcifications are seen throughout the medullary pyramids both kidneys, consistent with medullary nephrocalcinosis. No evidence ureteral calculi or hydronephrosis. No renal masses identified. No masses seen involving the ureters or bladder.  Stomach/Bowel: No evidence of obstruction, inflammatory process or abnormal fluid collections. Normal appendix visualized.  Vascular/Lymphatic: No pathologically enlarged lymph nodes. No abdominal aortic aneurysm.  Reproductive:  No mass or other significant abnormality.  Other:  None.  Musculoskeletal:  No suspicious bone lesions identified.  IMPRESSION: 1. Bilateral renal medullary nephrocalcinosis. No evidence  of ureteral calculi or hydronephrosis. 2. No radiographic evidence of urinary tract neoplasm.   Electronically Signed By: Danae Orleans M.D. On: 02/16/2020 11:52  Results for orders placed during the hospital encounter of 11/03/21  CT Renal Stone Study  Narrative CLINICAL DATA:  Flank pain, kidney stone suspected  EXAM: CT ABDOMEN AND PELVIS WITHOUT CONTRAST  TECHNIQUE: Multidetector CT imaging of the abdomen and pelvis was performed following the standard protocol without IV contrast.  RADIATION DOSE REDUCTION: This exam was performed according to the departmental dose-optimization program which includes automated exposure control, adjustment of the mA and/or kV according to patient size and/or use of iterative reconstruction technique.  COMPARISON:  04/06/2021.  FINDINGS: Lower chest: Mild dependent atelectasis is present at the lung bases.  Hepatobiliary: No focal liver abnormality is seen. No gallstones, gallbladder wall thickening, or biliary dilatation.  Pancreas: Unremarkable. No pancreatic ductal dilatation or surrounding inflammatory changes.  Spleen: Normal in size without focal abnormality.  Adrenals/Urinary Tract: The adrenal glands are within normal limits. Renal calculi are present bilaterally in the renal pyramids are hyperdense. There is mild obstructive uropathy on the left with a 5 mm calculus in at the ureterovesicular junction on the left. No obstructive uropathy on the right. The bladder is otherwise within normal limits.  Stomach/Bowel: Ingested debris is present in the stomach. No bowel obstruction, free air, or pneumatosis. A normal appendix is present in the right lower quadrant. No focal bowel wall thickening.  Vascular/Lymphatic: No significant vascular findings are present. No enlarged abdominal or pelvic lymph nodes.  Reproductive: Uterus and bilateral adnexa are unremarkable.  Other: No free fluid. A small fat containing umbilical  hernia is noted.  Musculoskeletal: Mild degenerative changes are noted in the thoracolumbar spine. No acute osseous abnormality.  IMPRESSION: 1. Mild obstructive uropathy on the left with a 5 mm calculus at the UVJ on the left. 2. Bilateral nephrolithiasis. 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   Electronically Signed By: Thornell Sartorius M.D. On: 11/04/2021 02:10   Assessment & Plan:    1. Nephrolithiasis -RTC 6 weeks with renal US - Urinalysis, Routine w reflex microscopic - Ultrasound renal complete; Future   Return in about 6 weeks (around 12/28/2021).  Wilkie Aye, MD  [facility] [place]

## 2021-11-19 ENCOUNTER — Ambulatory Visit: Payer: 59 | Admitting: Urology

## 2021-11-21 ENCOUNTER — Ambulatory Visit: Payer: Self-pay | Admitting: Urology

## 2021-11-26 ENCOUNTER — Ambulatory Visit: Payer: Self-pay | Admitting: Urology

## 2021-12-31 ENCOUNTER — Telehealth: Payer: Self-pay

## 2021-12-31 ENCOUNTER — Ambulatory Visit: Payer: BC Managed Care – PPO | Admitting: Urology

## 2021-12-31 DIAGNOSIS — N200 Calculus of kidney: Secondary | ICD-10-CM

## 2021-12-31 NOTE — Telephone Encounter (Signed)
Patient called office today to let you know she is feeling fine after passing her kidney stone and will call back if any stone symptoms re appear.  ?

## 2021-12-31 NOTE — Telephone Encounter (Signed)
Pt needing to ask questions regarding medication refills Dr. Ronne Binning fills for pt. ? ?Please advise. ? ?Return Call:  [phone]  ? ?Thanks, ?Rosey Bath ?

## 2022-05-02 ENCOUNTER — Ambulatory Visit: Payer: BC Managed Care – PPO | Admitting: Physician Assistant

## 2022-05-02 VITALS — BP 111/75 | HR 80

## 2022-05-02 DIAGNOSIS — R350 Frequency of micturition: Secondary | ICD-10-CM | POA: Diagnosis not present

## 2022-05-02 DIAGNOSIS — R3915 Urgency of urination: Secondary | ICD-10-CM

## 2022-05-02 DIAGNOSIS — N202 Calculus of kidney with calculus of ureter: Secondary | ICD-10-CM

## 2022-05-02 DIAGNOSIS — N200 Calculus of kidney: Secondary | ICD-10-CM

## 2022-05-02 DIAGNOSIS — Z87440 Personal history of urinary (tract) infections: Secondary | ICD-10-CM

## 2022-05-02 DIAGNOSIS — N3000 Acute cystitis without hematuria: Secondary | ICD-10-CM

## 2022-05-02 DIAGNOSIS — N390 Urinary tract infection, site not specified: Secondary | ICD-10-CM

## 2022-05-02 DIAGNOSIS — R300 Dysuria: Secondary | ICD-10-CM | POA: Diagnosis not present

## 2022-05-02 MED ORDER — NITROFURANTOIN MONOHYD MACRO 100 MG PO CAPS: 100.0000 mg | ORAL_CAPSULE | Freq: Two times a day (BID) | ORAL | 0 refills | Status: AC

## 2022-05-02 MED ORDER — MIRABEGRON ER 25 MG PO TB24
25.0000 mg | ORAL_TABLET | Freq: Every day | ORAL | 3 refills | Status: DC
Start: 2022-05-02 — End: 2023-01-15

## 2022-05-02 NOTE — Progress Notes (Signed)
Assessment: 1. Dysuria - Urinalysis, Routine w reflex microscopic    Plan: Macrobid prescribed and urine sent for culture.  We will adjust therapy if indicated by result.  Samples of Myrbetriq given and will refill prescription after she checks with her insurance on payment.  Keep follow-up visit as already scheduled for follow-up for stone disease.  Chief Complaint: No chief complaint on file.   HPI: Kristie Davidson is a 46 y.o. female with history of stone disease and frequent UTIs in the past who presents for evaluation of recent recurrence of UTI sxs. She c/o 1 week h/o urinary urgency, frequency, dysuria.  She also reports intermittent low-grade fevers and lower abdominal pain post void.  AZO has been somewhat helpful.  She has discontinued Myrbetriq since her last visit because of the cost.  She was satisfied with improvement of OAB symptoms while on it.  She denies flank pain, nausea or vomiting. UA= 0-5 WBCs, few bacteria, nitrite positive  11/16/21 Kristie Davidson is a 46yo here followup for nephrolithiasis. She denies any flank pain for 1 week. No worsening LUTS. KUB from today shows no definitive ureteral calculus. No other complaints today.   Portions of the above documentation were copied from a prior visit for review purposes only.  Allergies: Allergies  Allergen Reactions   Sumatriptan     Nausea,cold sweats,tremors,chills    PMH: Past Medical History:  Diagnosis Date   Acid reflux    Anxiety    Depression    High cholesterol     PSH: Past Surgical History:  Procedure Laterality Date   BREAST REDUCTION SURGERY     CESAREAN SECTION     MOUTH SURGERY  2000   NOSE SURGERY      SH: Social History   Tobacco Use   Smoking status: Never   Smokeless tobacco: Never    ROS: All other review of systems were reviewed and are negative except what is noted above in HPI  PE: BP 111/75   Pulse 80  GENERAL APPEARANCE:  Well appearing, well developed, well  nourished, NAD HEENT:  Atraumatic, normocephalic NECK:  Supple. Trachea midline ABDOMEN:  Soft, mild suprapubic tenderness, no masses EXTREMITIES:  Moves all extremities well, without clubbing, cyanosis, or edema NEUROLOGIC:  Alert and oriented x 3, normal gait, CN II-XII grossly intact MENTAL STATUS:  appropriate BACK:  Non-tender to palpation, No CVAT SKIN:  Warm, dry, and intact   Results: Laboratory Data: Lab Results  Component Value Date   WBC 8.7 11/03/2021   HGB 13.9 11/03/2021   HCT 38.4 11/03/2021   MCV 88.5 11/03/2021   PLT 257 11/03/2021    Lab Results  Component Value Date   CREATININE 0.80 11/03/2021     Urinalysis    Component Value Date/Time   COLORURINE ORANGE (A) 11/04/2021 0312   APPEARANCEUR Clear 11/16/2021 1105   LABSPEC 1.023 11/04/2021 0312   PHURINE 5.0 11/04/2021 0312   GLUCOSEU Negative 11/16/2021 1105   HGBUR MODERATE (A) 11/04/2021 0312   BILIRUBINUR Negative 11/16/2021 1105   KETONESUR NEGATIVE 11/04/2021 0312   PROTEINUR Negative 11/16/2021 1105   PROTEINUR NEGATIVE 11/04/2021 0312   UROBILINOGEN 0.2 01/12/2020 1552   NITRITE Negative 11/16/2021 1105   NITRITE POSITIVE (A) 11/04/2021 0312   LEUKOCYTESUR Negative 11/16/2021 1105   LEUKOCYTESUR NEGATIVE 11/04/2021 0312    Lab Results  Component Value Date   LABMICR Comment 11/16/2021   WBCUA 0-5 11/07/2021   LABEPIT 0-10 11/07/2021   BACTERIA Few (A) 11/07/2021  Pertinent Imaging: Results for orders placed during the hospital encounter of 11/16/21  Abdomen 1 view (KUB)  Narrative CLINICAL DATA:  Nephrolithiasis, left-sided kidney stone.  EXAM: ABDOMEN - 1 VIEW  COMPARISON:  CT renal stone 11/04/2021.  FINDINGS: Bowel-gas pattern is normal. Phleboliths are again identified in the pelvis. There are 2 rounded calcifications overlying the right renal contour measuring 8 mm and 4 mm which are not seen on prior examination. Visualized lung bases are clear. The osseous  structures are within normal limits.  IMPRESSION: 1. There are 2 new rounded densities overlying the right renal contour measuring up to 8 mm. These may represent bowel content, less likely new right renal calculi. 2. Nonobstructive bowel gas pattern.   Electronically Signed By: Darliss Cheney M.D. On: 11/17/2021 15:54  No results found for this or any previous visit.  No results found for this or any previous visit.  No results found for this or any previous visit.  Results for orders placed during the hospital encounter of 09/21/20  Ultrasound renal complete  Narrative CLINICAL DATA:  Nephrolithiasis  EXAM: RENAL / URINARY TRACT ULTRASOUND COMPLETE  COMPARISON:  Renal ultrasound November 18, 2019; CT abdomen and pelvis February 15, 2020  FINDINGS: Right Kidney:  Renal measurements: 10.9 x 4.8 x 6.2 cm = volume: 170.3 mL. Echogenicity within normal limits. No mass or hydronephrosis visualized. Subtle apparent medullary nephrocalcinosis, better seen on recent CT. No ureterectasis.  Left Kidney:  Renal measurements: 10.8 x 5.5 x 4.6 cm = volume: 141.4 mL. Echogenicity within normal limits. No mass or hydronephrosis visualized. Subtle apparent medullary nephrocalcinosis, better seen on recent CT. No ureterectasis.  Bladder:  Appears normal for degree of bladder distention. Flow from each distal ureter seen in the bladder.  Other:  None.  IMPRESSION: 1. Subtle changes of apparent medullary nephrocalcinosis, better seen on prior CT.  2. Kidneys otherwise appear normal bilaterally. No lesion involving urinary bladder.   Electronically Signed By: Bretta Bang III M.D. On: 09/21/2020 14:09  No results found for this or any previous visit.  Results for orders placed during the hospital encounter of 02/15/20  CT HEMATURIA WORKUP  Narrative CLINICAL DATA:  Gross hematuria. Recurrent urinary tract infections.  EXAM: CT ABDOMEN AND PELVIS WITHOUT AND WITH  CONTRAST  TECHNIQUE: Multidetector CT imaging of the abdomen and pelvis was performed following the standard protocol before and following the bolus administration of intravenous contrast.  CONTRAST:  OMNIPAQUE IOHEXOL 300 MG/ML  SOLN  COMPARISON:  None.  FINDINGS: Lower Chest: No acute findings.  Hepatobiliary: No hepatic masses identified. Gallbladder is unremarkable. No evidence of biliary ductal dilatation.  Pancreas:  No mass or inflammatory changes.  Spleen: Within normal limits in size and appearance.  Adrenals/Urinary Tract: No adrenal masses identified. Numerous tiny calcifications are seen throughout the medullary pyramids both kidneys, consistent with medullary nephrocalcinosis. No evidence ureteral calculi or hydronephrosis. No renal masses identified. No masses seen involving the ureters or bladder.  Stomach/Bowel: No evidence of obstruction, inflammatory process or abnormal fluid collections. Normal appendix visualized.  Vascular/Lymphatic: No pathologically enlarged lymph nodes. No abdominal aortic aneurysm.  Reproductive:  No mass or other significant abnormality.  Other:  None.  Musculoskeletal:  No suspicious bone lesions identified.  IMPRESSION: 1. Bilateral renal medullary nephrocalcinosis. No evidence of ureteral calculi or hydronephrosis. 2. No radiographic evidence of urinary tract neoplasm.   Electronically Signed By: Danae Orleans M.D. On: 02/16/2020 11:52  Results for orders placed during the hospital encounter of  11/03/21  CT Renal Stone Study  Narrative CLINICAL DATA:  Flank pain, kidney stone suspected  EXAM: CT ABDOMEN AND PELVIS WITHOUT CONTRAST  TECHNIQUE: Multidetector CT imaging of the abdomen and pelvis was performed following the standard protocol without IV contrast.  RADIATION DOSE REDUCTION: This exam was performed according to the departmental dose-optimization program which includes automated exposure  control, adjustment of the mA and/or kV according to patient size and/or use of iterative reconstruction technique.  COMPARISON:  04/06/2021.  FINDINGS: Lower chest: Mild dependent atelectasis is present at the lung bases.  Hepatobiliary: No focal liver abnormality is seen. No gallstones, gallbladder wall thickening, or biliary dilatation.  Pancreas: Unremarkable. No pancreatic ductal dilatation or surrounding inflammatory changes.  Spleen: Normal in size without focal abnormality.  Adrenals/Urinary Tract: The adrenal glands are within normal limits. Renal calculi are present bilaterally in the renal pyramids are hyperdense. There is mild obstructive uropathy on the left with a 5 mm calculus in at the ureterovesicular junction on the left. No obstructive uropathy on the right. The bladder is otherwise within normal limits.  Stomach/Bowel: Ingested debris is present in the stomach. No bowel obstruction, free air, or pneumatosis. A normal appendix is present in the right lower quadrant. No focal bowel wall thickening.  Vascular/Lymphatic: No significant vascular findings are present. No enlarged abdominal or pelvic lymph nodes.  Reproductive: Uterus and bilateral adnexa are unremarkable.  Other: No free fluid. A small fat containing umbilical hernia is noted.  Musculoskeletal: Mild degenerative changes are noted in the thoracolumbar spine. No acute osseous abnormality.  IMPRESSION: 1. Mild obstructive uropathy on the left with a 5 mm calculus at the UVJ on the left. 2. Bilateral nephrolithiasis. 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   Electronically Signed By: Thornell Sartorius M.D. On: 11/04/2021 02:10  No results found for this or any previous visit (from the past 24 hour(s)).

## 2022-05-03 LAB — MICROSCOPIC EXAMINATION: RBC, Urine: NONE SEEN /hpf (ref 0–2)

## 2022-05-03 LAB — URINALYSIS, ROUTINE W REFLEX MICROSCOPIC
Bilirubin, UA: NEGATIVE
Ketones, UA: NEGATIVE
Leukocytes,UA: NEGATIVE
Nitrite, UA: POSITIVE — AB
Protein,UA: NEGATIVE
RBC, UA: NEGATIVE
Specific Gravity, UA: 1.015 (ref 1.005–1.030)
Urobilinogen, Ur: 2 mg/dL — ABNORMAL HIGH (ref 0.2–1.0)
pH, UA: 7 (ref 5.0–7.5)

## 2022-05-05 LAB — URINE CULTURE

## 2022-05-13 ENCOUNTER — Other Ambulatory Visit: Payer: Self-pay | Admitting: Physician Assistant

## 2022-05-13 ENCOUNTER — Telehealth: Payer: Self-pay

## 2022-05-13 MED ORDER — PHENAZOPYRIDINE HCL 100 MG PO TABS: 100.0000 mg | ORAL_TABLET | Freq: Three times a day (TID) | ORAL | 0 refills | Status: DC | PRN

## 2022-05-13 NOTE — Telephone Encounter (Signed)
Left message for patient. If returns call, please schedule OV this week with Noreene Larsson.

## 2022-05-13 NOTE — Telephone Encounter (Signed)
Patient called advising that she is still having dysuria and urgency. She advised this is the first week of school  and she is unable to come in for an appt. She wanted to know if there was another antibiotic that could be sent in.   Pharmacy: [facility] - [place], [place] - [address]

## 2022-05-13 NOTE — Telephone Encounter (Signed)
Patient  left a voice message on 05-13-22.  Having reoccurring UTI symptoms after completing antibiotic.  Please advise.  Call back [phone]  Thanks, Rosey Bath

## 2022-05-14 NOTE — Telephone Encounter (Signed)
Patient reports her symptoms have improved after taking the full course of medication.  Patient will call office if her symptoms return and see needs to do a ua/uc

## 2022-09-16 IMAGING — CT CT RENAL STONE PROTOCOL
2 of 4 series · 16 of 46 positions shown, 18 images · non-contrast
Comparison: 04/06/2021.

CLINICAL DATA: Flank pain, kidney stone suspected



[Series 3: renal stone 5.0 · axial · 0.86mm/px · z∈[-440,+0]mm · 13 of 97 slices shown, 15 images]
[im 5/97  soft-tissue]
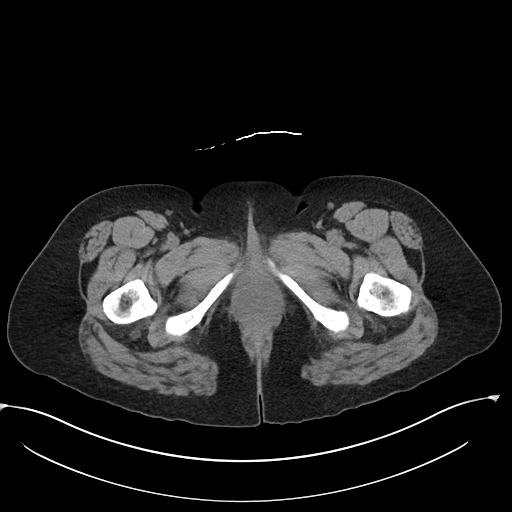
[im 5/97  bone]
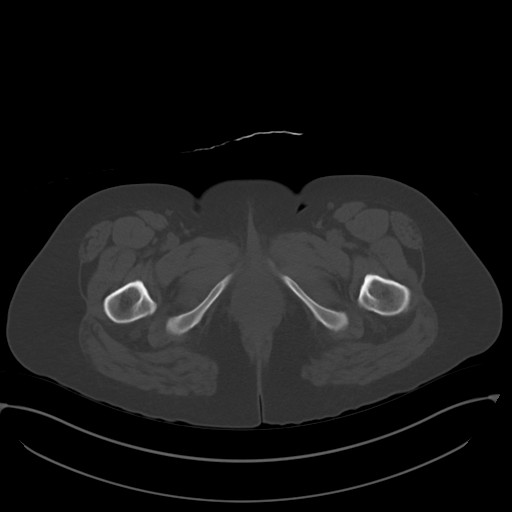
[im 13/97  soft-tissue]
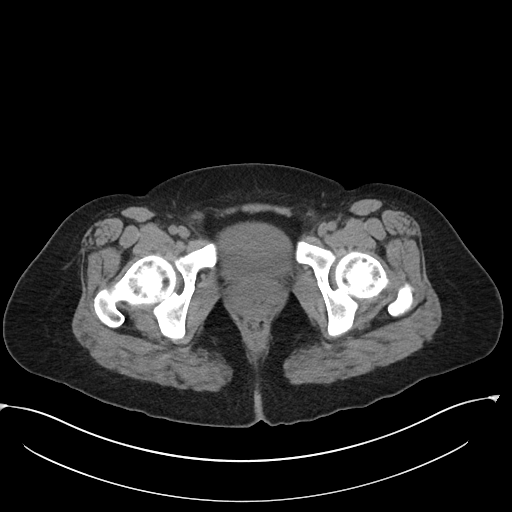
[im 21/97  soft-tissue]
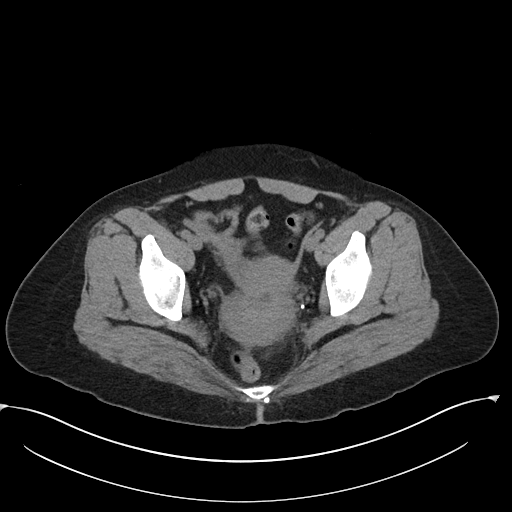
[im 29/97  soft-tissue]
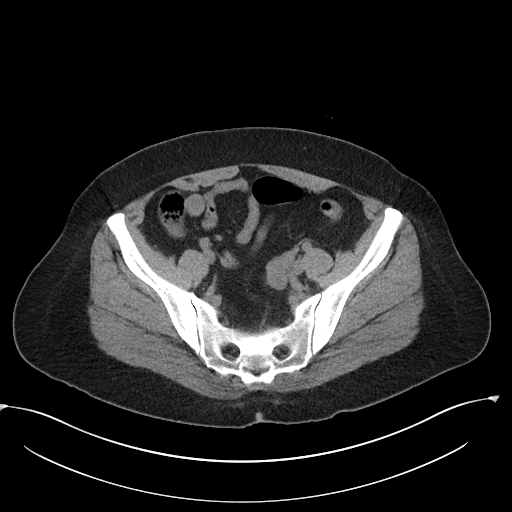
[im 33/97  soft-tissue]
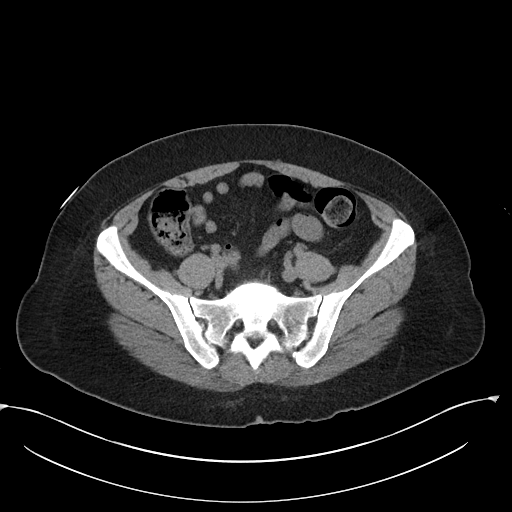
[im 41/97  soft-tissue]
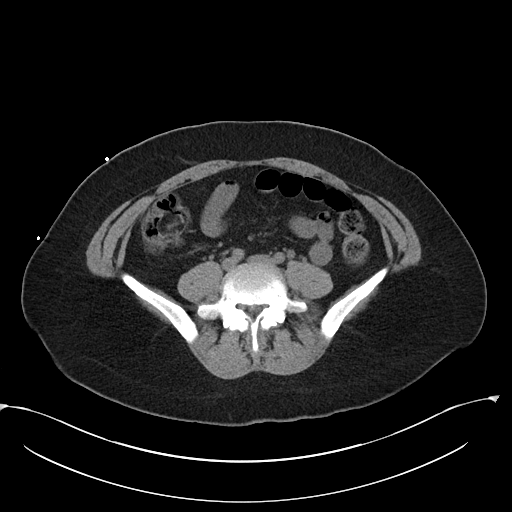
[im 49/97  soft-tissue]
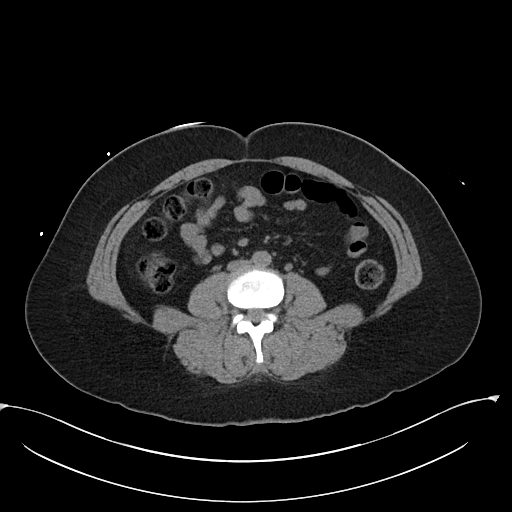
[im 57/97  soft-tissue]
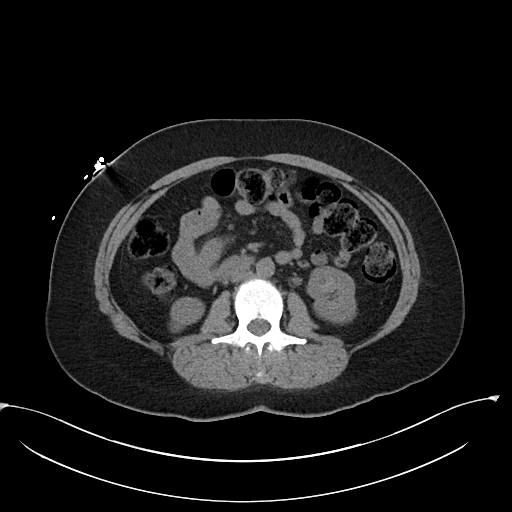
[im 65/97  soft-tissue]
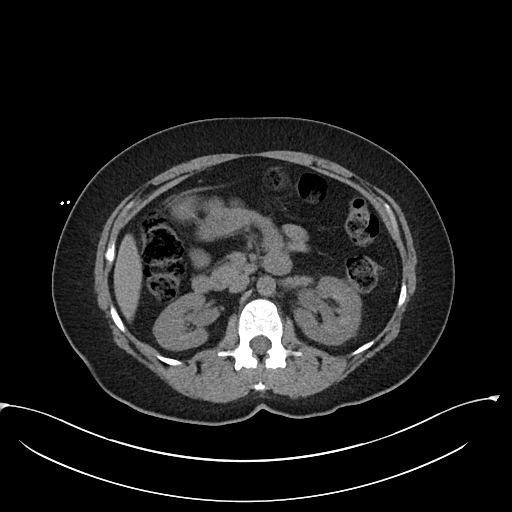
[im 65/97  bone]
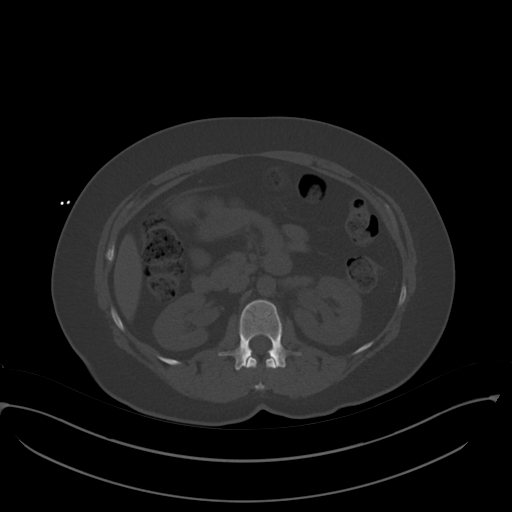
[im 69/97  soft-tissue]
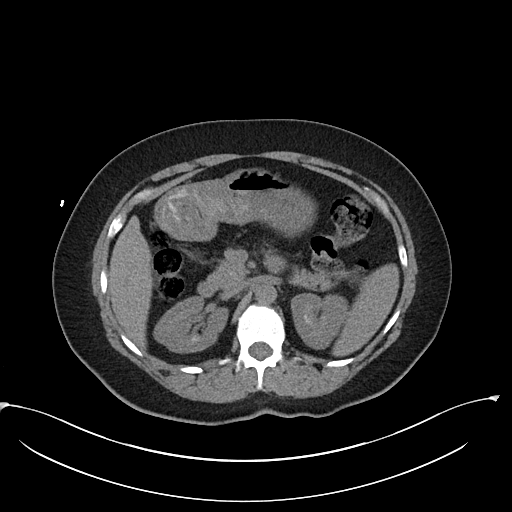
[im 77/97  soft-tissue]
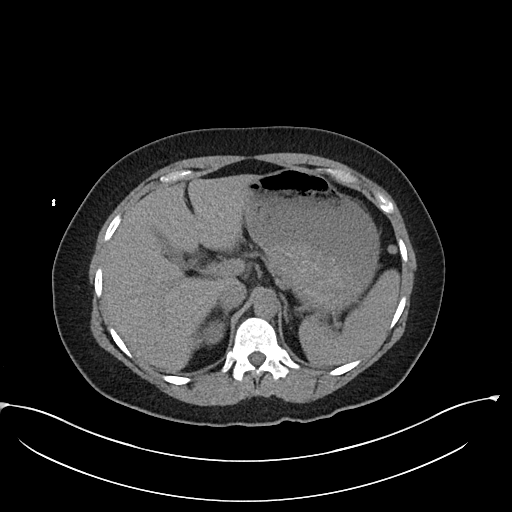
[im 85/97  soft-tissue]
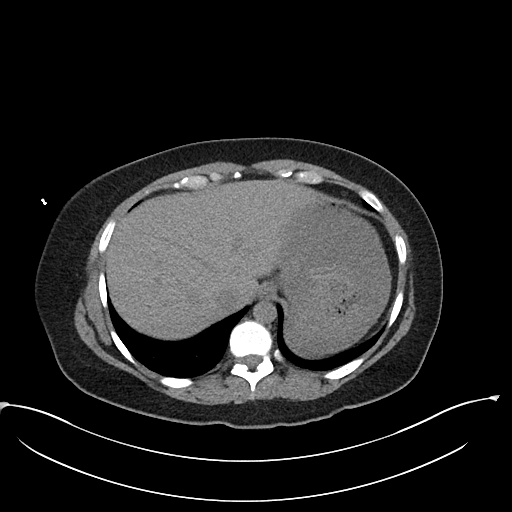
[im 93/97  soft-tissue]
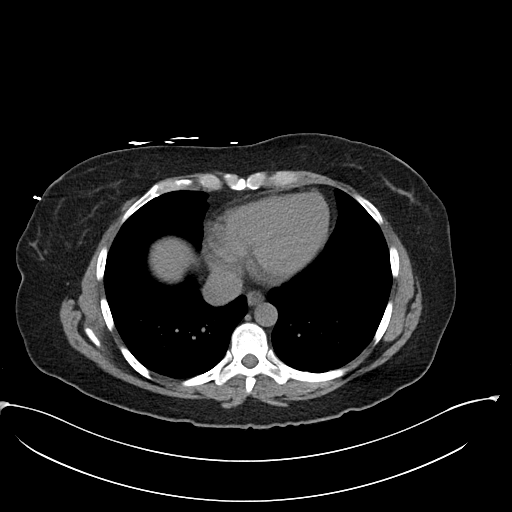

[Series 6: cor · coronal · 0.90mm/px · 3 of 147 slices shown]
[im 49/147  soft-tissue]
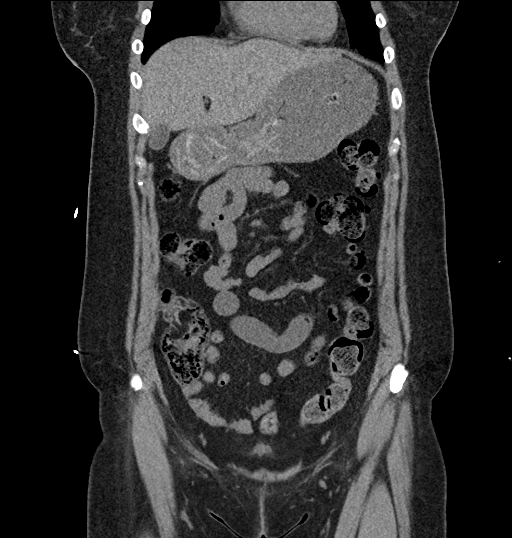
[im 65/147  soft-tissue]
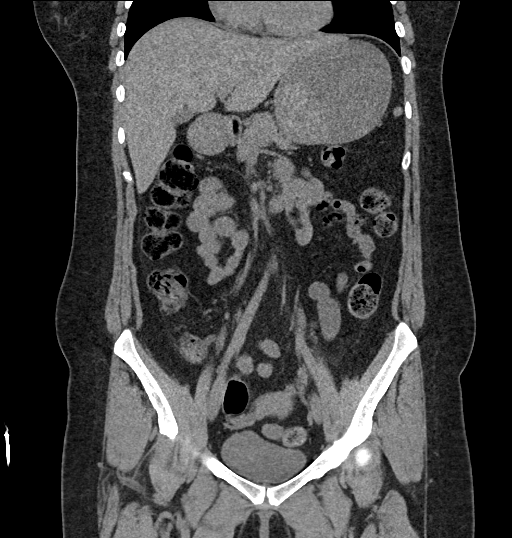
[im 82/147  soft-tissue]
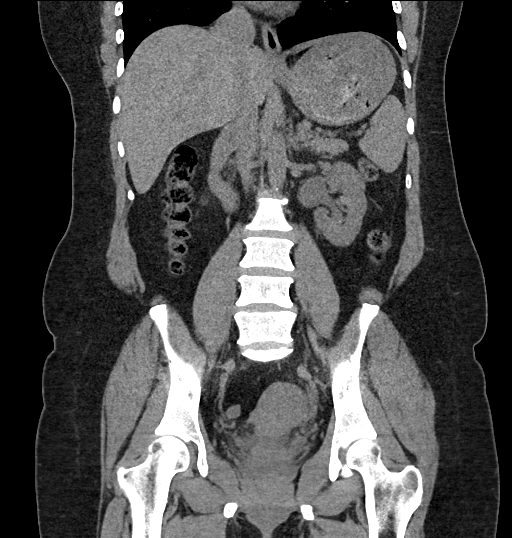

[16 of 46 positions shown; findings below may reference images not displayed]

FINDINGS: Lower chest: Mild dependent atelectasis is present at the lung
bases.

Hepatobiliary: No focal liver abnormality is seen. No gallstones,
gallbladder wall thickening, or biliary dilatation.

Pancreas: Unremarkable. No pancreatic ductal dilatation or
surrounding inflammatory changes.

Spleen: Normal in size without focal abnormality.

Adrenals/Urinary Tract: The adrenal glands are within normal limits.
Renal calculi are present bilaterally in the renal pyramids are
hyperdense. There is mild obstructive uropathy on the left with a 5
mm calculus in at the ureterovesicular junction on the left. No
obstructive uropathy on the right. The bladder is otherwise within
normal limits.

Stomach/Bowel: Ingested debris is present in the stomach. No bowel
obstruction, free air, or pneumatosis. A normal appendix is present
in the right lower quadrant. No focal bowel wall thickening.

Vascular/Lymphatic: No significant vascular findings are present. No
enlarged abdominal or pelvic lymph nodes.

Reproductive: Uterus and bilateral adnexa are unremarkable.

Other: No free fluid. A small fat containing umbilical hernia is
noted.

Musculoskeletal: Mild degenerative changes are noted in the
thoracolumbar spine. No acute osseous abnormality.
IMPRESSION: 1. Mild obstructive uropathy on the left with a 5 mm calculus at the
UVJ on the left.
2. Bilateral nephrolithiasis. The renal pyramids are hyperdense
suggesting medullary calcinosis.

## 2022-11-12 ENCOUNTER — Other Ambulatory Visit: Payer: Self-pay

## 2022-11-12 DIAGNOSIS — N200 Calculus of kidney: Secondary | ICD-10-CM

## 2022-11-12 MED ORDER — TAMSULOSIN HCL 0.4 MG PO CAPS: 0.4000 mg | ORAL_CAPSULE | Freq: Every day | ORAL | 3 refills | Status: DC

## 2022-11-13 ENCOUNTER — Ambulatory Visit: Payer: BC Managed Care – PPO | Admitting: Urology

## 2022-11-13 ENCOUNTER — Ambulatory Visit (HOSPITAL_COMMUNITY)
Admission: RE | Admit: 2022-11-13 | Discharge: 2022-11-13 | Disposition: A | Discharge status: Home / Self Care | Payer: BC Managed Care – PPO | Source: Ambulatory Visit | Attending: Physician Assistant | Admitting: Physician Assistant

## 2022-11-13 VITALS — BP 103/70 | HR 90

## 2022-11-13 DIAGNOSIS — N3000 Acute cystitis without hematuria: Secondary | ICD-10-CM | POA: Diagnosis present

## 2022-11-13 DIAGNOSIS — Z87440 Personal history of urinary (tract) infections: Secondary | ICD-10-CM | POA: Diagnosis not present

## 2022-11-13 DIAGNOSIS — Z87442 Personal history of urinary calculi: Secondary | ICD-10-CM | POA: Diagnosis not present

## 2022-11-13 DIAGNOSIS — N200 Calculus of kidney: Secondary | ICD-10-CM

## 2022-11-13 DIAGNOSIS — R3915 Urgency of urination: Secondary | ICD-10-CM | POA: Diagnosis not present

## 2022-11-13 DIAGNOSIS — N390 Urinary tract infection, site not specified: Secondary | ICD-10-CM

## 2022-11-13 DIAGNOSIS — R350 Frequency of micturition: Secondary | ICD-10-CM

## 2022-11-13 MED ORDER — NITROFURANTOIN MONOHYD MACRO 100 MG PO CAPS: 100.0000 mg | ORAL_CAPSULE | Freq: Two times a day (BID) | ORAL | 0 refills | Status: DC

## 2022-11-13 MED ORDER — NITROFURANTOIN MONOHYD MACRO 100 MG PO CAPS: 100.0000 mg | ORAL_CAPSULE | ORAL | 3 refills | Status: DC | PRN

## 2022-11-13 NOTE — Progress Notes (Unsigned)
11/13/2022 4:20 PM   Kristie Davidson 1976-01-15 [id number]  Referring provider: Practice, [facility],  [place] [id number]  Followup UTI and nephrolithiasis   HPI: She has constipation and KUB shows significant stool burden. She gets UTIs often after intercourse   PMH: Past Medical History:  Diagnosis Date   Acid reflux    Anxiety    Depression    High cholesterol     Surgical History: Past Surgical History:  Procedure Laterality Date   BREAST REDUCTION SURGERY     CESAREAN SECTION     MOUTH SURGERY  2000   NOSE SURGERY      Home Medications:  Allergies as of 11/13/2022       Reactions   Sumatriptan    Nausea,cold sweats,tremors,chills        Medication List        Accurate as of November 13, 2022  4:20 PM. If you have any questions, ask your nurse or doctor.          amphetamine-dextroamphetamine 20 MG tablet Commonly known as: ADDERALL Take 20 mg by mouth 2 (two) times daily.   BIOTIN PO Take 1 capsule by mouth daily.   cetirizine 10 MG tablet Commonly known as: ZYRTEC Take 10 mg by mouth daily.   ferrous sulfate 325 (65 FE) MG tablet Take 325 mg by mouth daily with breakfast.   HYDROcodone-acetaminophen 5-325 MG tablet Commonly known as: NORCO/VICODIN Take 1-2 tablets by mouth every 6 (six) hours as needed.   Latuda 40 MG Tabs tablet Generic drug: lurasidone   LORazepam 1 MG tablet Commonly known as: ATIVAN Take 1 mg by mouth as needed for anxiety.   MAGNESIUM PO Take 1 tablet by mouth daily.   mirabegron ER 25 MG Tb24 tablet Commonly known as: MYRBETRIQ Take 1 tablet (25 mg total) by mouth daily.   montelukast 10 MG tablet Commonly known as: SINGULAIR Take 10 mg by mouth daily.   multivitamin tablet Take 1 tablet by mouth daily.   naproxen sodium 220 MG tablet Commonly known as: ALEVE Take 220 mg by mouth daily as needed (pain/headache).   omeprazole 20 MG capsule Commonly known as: PRILOSEC Take  20 mg by mouth daily.   ondansetron 4 MG disintegrating tablet Commonly known as: ZOFRAN-ODT Take 1 tablet (4 mg total) by mouth every 8 (eight) hours as needed for nausea or vomiting.   phenazopyridine 100 MG tablet Commonly known as: Pyridium Take 1 tablet (100 mg total) by mouth 3 (three) times daily as needed for pain.   Pristiq 100 MG 24 hr tablet Generic drug: desvenlafaxine   simvastatin 20 MG tablet Commonly known as: ZOCOR Take 20 mg by mouth daily.   tamsulosin 0.4 MG Caps capsule Commonly known as: FLOMAX Take 1 capsule (0.4 mg total) by mouth daily.        Allergies:  Allergies  Allergen Reactions   Sumatriptan     Nausea,cold sweats,tremors,chills    Family History: Family History  Problem Relation Age of Onset   Kidney Stones Mother    Kidney Stones Sister     Social History:  reports that she has never smoked. She has never used smokeless tobacco. No history on file for alcohol use and drug use.  ROS: All other review of systems were reviewed and are negative except what is noted above in HPI  Physical Exam: BP 103/70   Pulse 90   Constitutional:  Alert and oriented, No acute distress.  HEENT: [place] AT, moist mucus membranes.  Trachea midline, no masses. Cardiovascular: No clubbing, cyanosis, or edema. Respiratory: Normal respiratory effort, no increased work of breathing. GI: Abdomen is soft, nontender, nondistended, no abdominal masses GU: No CVA tenderness.  Lymph: No cervical or inguinal lymphadenopathy. Skin: No rashes, bruises or suspicious lesions. Neurologic: Grossly intact, no focal deficits, moving all 4 extremities. Psychiatric: Normal mood and affect.  Laboratory Data: Lab Results  Component Value Date   WBC 8.7 11/03/2021   HGB 13.9 11/03/2021   HCT 38.4 11/03/2021   MCV 88.5 11/03/2021   PLT 257 11/03/2021    Lab Results  Component Value Date   CREATININE 0.80 11/03/2021    No results found for: "PSA"  No results  found for: "TESTOSTERONE"  No results found for: "HGBA1C"  Urinalysis    Component Value Date/Time   COLORURINE ORANGE (A) 11/04/2021 0312   APPEARANCEUR Hazy (A) 05/02/2022 1059   LABSPEC 1.023 11/04/2021 0312   PHURINE 5.0 11/04/2021 0312   GLUCOSEU Trace (A) 05/02/2022 1059   HGBUR MODERATE (A) 11/04/2021 0312   BILIRUBINUR Negative 05/02/2022 1059   KETONESUR NEGATIVE 11/04/2021 0312   PROTEINUR Negative 05/02/2022 1059   PROTEINUR NEGATIVE 11/04/2021 0312   UROBILINOGEN 0.2 01/12/2020 1552   NITRITE Positive (A) 05/02/2022 1059   NITRITE POSITIVE (A) 11/04/2021 0312   LEUKOCYTESUR Negative 05/02/2022 1059   LEUKOCYTESUR NEGATIVE 11/04/2021 0312    Lab Results  Component Value Date   LABMICR See below: 05/02/2022   WBCUA 0-5 05/02/2022   LABEPIT 0-10 05/02/2022   BACTERIA Few 05/02/2022    Pertinent Imaging: *** Results for orders placed during the hospital encounter of 11/16/21  Abdomen 1 view (KUB)  Narrative CLINICAL DATA:  Nephrolithiasis, left-sided kidney stone.  EXAM: ABDOMEN - 1 VIEW  COMPARISON:  CT renal stone 11/04/2021.  FINDINGS: Bowel-gas pattern is normal. Phleboliths are again identified in the pelvis. There are 2 rounded calcifications overlying the right renal contour measuring 8 mm and 4 mm which are not seen on prior examination. Visualized lung bases are clear. The osseous structures are within normal limits.  IMPRESSION: 1. There are 2 new rounded densities overlying the right renal contour measuring up to 8 mm. These may represent bowel content, less likely new right renal calculi. 2. Nonobstructive bowel gas pattern.   Electronically Signed By: Ronney Asters M.D. On: 11/17/2021 15:54  No results found for this or any previous visit.  No results found for this or any previous visit.  No results found for this or any previous visit.  Results for orders placed during the hospital encounter of 09/21/20  Ultrasound renal  complete  Narrative CLINICAL DATA:  Nephrolithiasis  EXAM: RENAL / URINARY TRACT ULTRASOUND COMPLETE  COMPARISON:  Renal ultrasound November 18, 2019; CT abdomen and pelvis February 15, 2020  FINDINGS: Right Kidney:  Renal measurements: 10.9 x 4.8 x 6.2 cm = volume: 170.3 mL. Echogenicity within normal limits. No mass or hydronephrosis visualized. Subtle apparent medullary nephrocalcinosis, better seen on recent CT. No ureterectasis.  Left Kidney:  Renal measurements: 10.8 x 5.5 x 4.6 cm = volume: 141.4 mL. Echogenicity within normal limits. No mass or hydronephrosis visualized. Subtle apparent medullary nephrocalcinosis, better seen on recent CT. No ureterectasis.  Bladder:  Appears normal for degree of bladder distention. Flow from each distal ureter seen in the bladder.  Other:  None.  IMPRESSION: 1. Subtle changes of apparent medullary nephrocalcinosis, better seen on prior CT.  2. Kidneys otherwise appear normal  bilaterally. No lesion involving urinary bladder.   Electronically Signed By: Lowella Grip III M.D. On: 09/21/2020 14:09  No valid procedures specified. Results for orders placed during the hospital encounter of 02/15/20  CT HEMATURIA WORKUP  Narrative CLINICAL DATA:  Gross hematuria. Recurrent urinary tract infections.  EXAM: CT ABDOMEN AND PELVIS WITHOUT AND WITH CONTRAST  TECHNIQUE: Multidetector CT imaging of the abdomen and pelvis was performed following the standard protocol before and following the bolus administration of intravenous contrast.  CONTRAST:  159m OMNIPAQUE IOHEXOL 300 MG/ML  SOLN  COMPARISON:  None.  FINDINGS: Lower Chest: No acute findings.  Hepatobiliary: No hepatic masses identified. Gallbladder is unremarkable. No evidence of biliary ductal dilatation.  Pancreas:  No mass or inflammatory changes.  Spleen: Within normal limits in size and appearance.  Adrenals/Urinary Tract: No adrenal masses identified.  Numerous tiny calcifications are seen throughout the medullary pyramids both kidneys, consistent with medullary nephrocalcinosis. No evidence ureteral calculi or hydronephrosis. No renal masses identified. No masses seen involving the ureters or bladder.  Stomach/Bowel: No evidence of obstruction, inflammatory process or abnormal fluid collections. Normal appendix visualized.  Vascular/Lymphatic: No pathologically enlarged lymph nodes. No abdominal aortic aneurysm.  Reproductive:  No mass or other significant abnormality.  Other:  None.  Musculoskeletal:  No suspicious bone lesions identified.  IMPRESSION: 1. Bilateral renal medullary nephrocalcinosis. No evidence of ureteral calculi or hydronephrosis. 2. No radiographic evidence of urinary tract neoplasm.   Electronically Signed By: JMarlaine HindM.D. On: 02/16/2020 11:52  Results for orders placed during the hospital encounter of 11/03/21  CT Renal Stone Study  Narrative CLINICAL DATA:  Flank pain, kidney stone suspected  EXAM: CT ABDOMEN AND PELVIS WITHOUT CONTRAST  TECHNIQUE: Multidetector CT imaging of the abdomen and pelvis was performed following the standard protocol without IV contrast.  RADIATION DOSE REDUCTION: This exam was performed according to the departmental dose-optimization program which includes automated exposure control, adjustment of the mA and/or kV according to patient size and/or use of iterative reconstruction technique.  COMPARISON:  04/06/2021.  FINDINGS: Lower chest: Mild dependent atelectasis is present at the lung bases.  Hepatobiliary: No focal liver abnormality is seen. No gallstones, gallbladder wall thickening, or biliary dilatation.  Pancreas: Unremarkable. No pancreatic ductal dilatation or surrounding inflammatory changes.  Spleen: Normal in size without focal abnormality.  Adrenals/Urinary Tract: The adrenal glands are within normal limits. Renal calculi are present  bilaterally in the renal pyramids are hyperdense. There is mild obstructive uropathy on the left with a 5 mm calculus in at the ureterovesicular junction on the left. No obstructive uropathy on the right. The bladder is otherwise within normal limits.  Stomach/Bowel: Ingested debris is present in the stomach. No bowel obstruction, free air, or pneumatosis. A normal appendix is present in the right lower quadrant. No focal bowel wall thickening.  Vascular/Lymphatic: No significant vascular findings are present. No enlarged abdominal or pelvic lymph nodes.  Reproductive: Uterus and bilateral adnexa are unremarkable.  Other: No free fluid. A small fat containing umbilical hernia is noted.  Musculoskeletal: Mild degenerative changes are noted in the thoracolumbar spine. No acute osseous abnormality.  IMPRESSION: 1. Mild obstructive uropathy on the left with a 5 mm calculus at the UVJ on the left. 2. Bilateral nephrolithiasis. 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   Electronically Signed By: LBrett FairyM.D. On: 11/04/2021 02:10   Assessment & Plan:    1. Nephrolithiasis -followup 6 months with UTI - Urinalysis, Routine w reflex  microscopic  2. Recurrent UTI -macrobid '100mg'$  BID for 7 days - Urine Culture   No follow-ups on file.  Nicolette Bang, MD  [facility] [place]

## 2022-11-14 ENCOUNTER — Encounter: Payer: Self-pay | Admitting: Urology

## 2022-11-14 LAB — URINALYSIS, ROUTINE W REFLEX MICROSCOPIC
Bilirubin, UA: NEGATIVE
Glucose, UA: NEGATIVE
Ketones, UA: NEGATIVE
Nitrite, UA: NEGATIVE
Protein,UA: NEGATIVE
RBC, UA: NEGATIVE
Specific Gravity, UA: 1.015 (ref 1.005–1.030)
Urobilinogen, Ur: 1 mg/dL (ref 0.2–1.0)
pH, UA: 7 (ref 5.0–7.5)

## 2022-11-14 LAB — MICROSCOPIC EXAMINATION: Epithelial Cells (non renal): >10 /hpf — AB (ref 0–10)

## 2022-11-14 NOTE — Patient Instructions (Signed)
Urinary Tract Infection, Adult  A urinary tract infection (UTI) is an infection of any part of the urinary tract. The urinary tract includes the kidneys, ureters, bladder, and urethra. These organs make, store, and get rid of urine in the body. An upper UTI affects the ureters and kidneys. A lower UTI affects the bladder and urethra. What are the causes? Most urinary tract infections are caused by bacteria in your genital area around your urethra, where urine leaves your body. These bacteria grow and cause inflammation of your urinary tract. What increases the risk? You are more likely to develop this condition if: You have a urinary catheter that stays in place. You are not able to control when you urinate or have a bowel movement (incontinence). You are female and you: Use a spermicide or diaphragm for birth control. Have low estrogen levels. Are pregnant. You have certain genes that increase your risk. You are sexually active. You take antibiotic medicines. You have a condition that causes your flow of urine to slow down, such as: An enlarged prostate, if you are female. Blockage in your urethra. A kidney stone. A nerve condition that affects your bladder control (neurogenic bladder). Not getting enough to drink, or not urinating often. You have certain medical conditions, such as: Diabetes. A weak disease-fighting system (immunesystem). Sickle cell disease. Gout. Spinal cord injury. What are the signs or symptoms? Symptoms of this condition include: Needing to urinate right away (urgency). Frequent urination. This may include small amounts of urine each time you urinate. Pain or burning with urination. Blood in the urine. Urine that smells bad or unusual. Trouble urinating. Cloudy urine. Vaginal discharge, if you are female. Pain in the abdomen or the lower back. You may also have: Vomiting or a decreased appetite. Confusion. Irritability or tiredness. A fever or  chills. Diarrhea. The first symptom in older adults may be confusion. In some cases, they may not have any symptoms until the infection has worsened. How is this diagnosed? This condition is diagnosed based on your medical history and a physical exam. You may also have other tests, including: Urine tests. Blood tests. Tests for STIs (sexually transmitted infections). If you have had more than one UTI, a cystoscopy or imaging studies may be done to determine the cause of the infections. How is this treated? Treatment for this condition includes: Antibiotic medicine. Over-the-counter medicines to treat discomfort. Drinking enough water to stay hydrated. If you have frequent infections or have other conditions such as a kidney stone, you may need to see a health care provider who specializes in the urinary tract (urologist). In rare cases, urinary tract infections can cause sepsis. Sepsis is a life-threatening condition that occurs when the body responds to an infection. Sepsis is treated in the hospital with IV antibiotics, fluids, and other medicines. Follow these instructions at home:  Medicines Take over-the-counter and prescription medicines only as told by your health care provider. If you were prescribed an antibiotic medicine, take it as told by your health care provider. Do not stop using the antibiotic even if you start to feel better. General instructions Make sure you: Empty your bladder often and completely. Do not hold urine for long periods of time. Empty your bladder after sex. Wipe from front to back after urinating or having a bowel movement if you are female. Use each tissue only one time when you wipe. Drink enough fluid to keep your urine pale yellow. Keep all follow-up visits. This is important. Contact a health   care provider if: Your symptoms do not get better after 1-2 days. Your symptoms go away and then return. Get help right away if: You have severe pain in  your back or your lower abdomen. You have a fever or chills. You have nausea or vomiting. Summary A urinary tract infection (UTI) is an infection of any part of the urinary tract, which includes the kidneys, ureters, bladder, and urethra. Most urinary tract infections are caused by bacteria in your genital area. Treatment for this condition often includes antibiotic medicines. If you were prescribed an antibiotic medicine, take it as told by your health care provider. Do not stop using the antibiotic even if you start to feel better. Keep all follow-up visits. This is important. This information is not intended to replace advice given to you by your health care provider. Make sure you discuss any questions you have with your health care provider. Document Revised: 04/14/2020 Document Reviewed: 04/14/2020 Elsevier Patient Education  2023 [employer] Inc[employer]  

## 2022-11-16 LAB — URINE CULTURE

## 2023-01-14 ENCOUNTER — Other Ambulatory Visit: Payer: Self-pay

## 2023-01-14 DIAGNOSIS — N200 Calculus of kidney: Secondary | ICD-10-CM

## 2023-01-15 ENCOUNTER — Ambulatory Visit (HOSPITAL_COMMUNITY)
Admission: RE | Admit: 2023-01-15 | Discharge: 2023-01-15 | Disposition: A | Discharge status: Home / Self Care | Payer: BC Managed Care – PPO | Source: Ambulatory Visit | Attending: Urology | Admitting: Urology

## 2023-01-15 ENCOUNTER — Ambulatory Visit: Payer: BC Managed Care – PPO | Admitting: Urology

## 2023-01-15 VITALS — BP 119/86 | HR 91

## 2023-01-15 DIAGNOSIS — N200 Calculus of kidney: Secondary | ICD-10-CM

## 2023-01-15 DIAGNOSIS — N201 Calculus of ureter: Secondary | ICD-10-CM | POA: Diagnosis not present

## 2023-01-15 DIAGNOSIS — Z87440 Personal history of urinary (tract) infections: Secondary | ICD-10-CM | POA: Diagnosis not present

## 2023-01-15 DIAGNOSIS — N3281 Overactive bladder: Secondary | ICD-10-CM

## 2023-01-15 DIAGNOSIS — N390 Urinary tract infection, site not specified: Secondary | ICD-10-CM

## 2023-01-15 MED ORDER — NITROFURANTOIN MACROCRYSTAL 50 MG PO CAPS: 50.0000 mg | ORAL_CAPSULE | Freq: Every evening | ORAL | 11 refills | Status: DC | PRN

## 2023-01-15 MED ORDER — MIRABEGRON ER 25 MG PO TB24: 25.0000 mg | ORAL_TABLET | Freq: Every day | ORAL | 3 refills | Status: DC

## 2023-01-15 NOTE — Progress Notes (Signed)
01/15/2023 4:05 PM   Kristie Davidson [birth year] [id number]  Referring provider: Practice, [facility] [address] [place],  [place] [address]  Followup nephrolithiasis, OAb and recurrent UTI   HPI: Kristie Davidson is a 46yo here for followup for nephrolithiasis, OAB and recurrent UTI. No stone events since last visit. KUB from today shows no calculi. NO UTIs since starting macrodantin prophylaxis. She was bothersome urinary urgency, frequency and occasional urge incontinence.    PMH: Past Medical History:  Diagnosis Date   Acid reflux    Anxiety    Depression    High cholesterol     Surgical History: Past Surgical History:  Procedure Laterality Date   BREAST REDUCTION SURGERY     CESAREAN SECTION     MOUTH SURGERY  2000   NOSE SURGERY      Home Medications:  Allergies as of 01/15/2023       Reactions   Sumatriptan    Nausea,cold sweats,tremors,chills        Medication List        Accurate as of Jan 15, 2023  4:05 PM. If you have any questions, ask your nurse or doctor.          amphetamine-dextroamphetamine 20 MG tablet Commonly known as: ADDERALL Take 20 mg by mouth 2 (two) times daily.   BIOTIN PO Take 1 capsule by mouth daily.   cetirizine 10 MG tablet Commonly known as: ZYRTEC Take 10 mg by mouth daily.   ferrous sulfate 325 (65 FE) MG tablet Take 325 mg by mouth daily with breakfast.   HYDROcodone-acetaminophen 5-325 MG tablet Commonly known as: NORCO/VICODIN Take 1-2 tablets by mouth every 6 (six) hours as needed.   Latuda 40 MG Tabs tablet Generic drug: lurasidone   LORazepam 1 MG tablet Commonly known as: ATIVAN Take 1 mg by mouth as needed for anxiety.   MAGNESIUM PO Take 1 tablet by mouth daily.   mirabegron ER 25 MG Tb24 tablet Commonly known as: MYRBETRIQ Take 1 tablet (25 mg total) by mouth daily.   montelukast 10 MG tablet Commonly known as: SINGULAIR Take 10 mg by mouth daily.   multivitamin tablet Take 1 tablet by  mouth daily.   naproxen sodium 220 MG tablet Commonly known as: ALEVE Take 220 mg by mouth daily as needed (pain/headache).   nitrofurantoin (macrocrystal-monohydrate) 100 MG capsule Commonly known as: MACROBID Take 1 capsule (100 mg total) by mouth every 12 (twelve) hours.   nitrofurantoin (macrocrystal-monohydrate) 100 MG capsule Commonly known as: MACROBID Take 1 capsule (100 mg total) by mouth as needed.   nitrofurantoin 50 MG capsule Commonly known as: Macrodantin Take 1 capsule (50 mg total) by mouth at bedtime as needed.   omeprazole 20 MG capsule Commonly known as: PRILOSEC Take 20 mg by mouth daily.   ondansetron 4 MG disintegrating tablet Commonly known as: ZOFRAN-ODT Take 1 tablet (4 mg total) by mouth every 8 (eight) hours as needed for nausea or vomiting.   phenazopyridine 100 MG tablet Commonly known as: Pyridium Take 1 tablet (100 mg total) by mouth 3 (three) times daily as needed for pain.   Pristiq 100 MG 24 hr tablet Generic drug: desvenlafaxine   simvastatin 20 MG tablet Commonly known as: ZOCOR Take 20 mg by mouth daily.   tamsulosin 0.4 MG Caps capsule Commonly known as: FLOMAX Take 1 capsule (0.4 mg total) by mouth daily.        Allergies:  Allergies  Allergen Reactions   Sumatriptan  Nausea,cold sweats,tremors,chills    Family History: Family History  Problem Relation Age of Onset   Kidney Stones Mother    Kidney Stones Sister     Social History:  reports that she has never smoked. She has never used smokeless tobacco. No history on file for alcohol use and drug use.  ROS: All other review of systems were reviewed and are negative except what is noted above in HPI  Physical Exam: BP 119/86   Pulse 91   Constitutional:  Alert and oriented, No acute distress. HEENT: [place] AT, moist mucus membranes.  Trachea midline, no masses. Cardiovascular: No clubbing, cyanosis, or edema. Respiratory: Normal respiratory effort, no increased  work of breathing. GI: Abdomen is soft, nontender, nondistended, no abdominal masses GU: No CVA tenderness.  Lymph: No cervical or inguinal lymphadenopathy. Skin: No rashes, bruises or suspicious lesions. Neurologic: Grossly intact, no focal deficits, moving all 4 extremities. Psychiatric: Normal mood and affect.  Laboratory Data: Lab Results  Component Value Date   WBC 8.7 11/03/2021   HGB 13.9 11/03/2021   HCT 38.4 11/03/2021   MCV 88.5 11/03/2021   PLT 257 11/03/2021    Lab Results  Component Value Date   CREATININE 0.80 11/03/2021    No results found for: "PSA"  No results found for: "TESTOSTERONE"  No results found for: "HGBA1C"  Urinalysis    Component Value Date/Time   COLORURINE ORANGE (A) 11/04/2021 0312   APPEARANCEUR Clear 11/13/2022 1557   LABSPEC 1.023 11/04/2021 0312   PHURINE 5.0 11/04/2021 0312   GLUCOSEU Negative 11/13/2022 1557   HGBUR MODERATE (A) 11/04/2021 0312   BILIRUBINUR Negative 11/13/2022 1557   KETONESUR NEGATIVE 11/04/2021 0312   PROTEINUR Negative 11/13/2022 1557   PROTEINUR NEGATIVE 11/04/2021 0312   UROBILINOGEN 0.2 01/12/2020 1552   NITRITE Negative 11/13/2022 1557   NITRITE POSITIVE (A) 11/04/2021 0312   LEUKOCYTESUR Trace (A) 11/13/2022 1557   LEUKOCYTESUR NEGATIVE 11/04/2021 0312    Lab Results  Component Value Date   LABMICR See below: 11/13/2022   WBCUA 6-10 (A) 11/13/2022   LABEPIT >10 (A) 11/13/2022   BACTERIA Moderate (A) 11/13/2022    Pertinent Imaging: KUB today: Images reviewed and discussed with the patient  Results for orders placed during the hospital encounter of 11/13/22  DG Abd 1 View  Narrative CLINICAL DATA:  Nephrolithiasis.  EXAM: ABDOMEN - 1 VIEW  COMPARISON:  11/16/2021.  CT, 11/04/2021.  FINDINGS: No visualized renal or ureteral stone. Small stable phleboliths in the pelvis. Soft tissues otherwise unremarkable.  Normal bowel gas pattern.  Skeletal structures are  unremarkable.  IMPRESSION: 1. No evidence of renal or ureteral stones.  No acute findings.   Electronically Signed By: Amie Portland M.D. On: 11/15/2022 15:46  No results found for this or any previous visit.  No results found for this or any previous visit.  No results found for this or any previous visit.  Results for orders placed during the hospital encounter of 09/21/20  Ultrasound renal complete  Narrative CLINICAL DATA:  Nephrolithiasis  EXAM: RENAL / URINARY TRACT ULTRASOUND COMPLETE  COMPARISON:  Renal ultrasound November 18, 2019; CT abdomen and pelvis February 15, 2020  FINDINGS: Right Kidney:  Renal measurements: 10.9 x 4.8 x 6.2 cm = volume: 170.3 mL. Echogenicity within normal limits. No mass or hydronephrosis visualized. Subtle apparent medullary nephrocalcinosis, better seen on recent CT. No ureterectasis.  Left Kidney:  Renal measurements: 10.8 x 5.5 x 4.6 cm = volume: 141.4 mL. Echogenicity within normal limits.  No mass or hydronephrosis visualized. Subtle apparent medullary nephrocalcinosis, better seen on recent CT. No ureterectasis.  Bladder:  Appears normal for degree of bladder distention. Flow from each distal ureter seen in the bladder.  Other:  None.  IMPRESSION: 1. Subtle changes of apparent medullary nephrocalcinosis, better seen on prior CT.  2. Kidneys otherwise appear normal bilaterally. No lesion involving urinary bladder.   Electronically Signed By: Bretta Bang III M.D. On: 09/21/2020 14:09  No valid procedures specified. Results for orders placed during the hospital encounter of 02/15/20  CT HEMATURIA WORKUP  Narrative CLINICAL DATA:  Gross hematuria. Recurrent urinary tract infections.  EXAM: CT ABDOMEN AND PELVIS WITHOUT AND WITH CONTRAST  TECHNIQUE: Multidetector CT imaging of the abdomen and pelvis was performed following the standard protocol before and following the bolus administration of intravenous  contrast.  CONTRAST:  OMNIPAQUE IOHEXOL 300 MG/ML  SOLN  COMPARISON:  None.  FINDINGS: Lower Chest: No acute findings.  Hepatobiliary: No hepatic masses identified. Gallbladder is unremarkable. No evidence of biliary ductal dilatation.  Pancreas:  No mass or inflammatory changes.  Spleen: Within normal limits in size and appearance.  Adrenals/Urinary Tract: No adrenal masses identified. Numerous tiny calcifications are seen throughout the medullary pyramids both kidneys, consistent with medullary nephrocalcinosis. No evidence ureteral calculi or hydronephrosis. No renal masses identified. No masses seen involving the ureters or bladder.  Stomach/Bowel: No evidence of obstruction, inflammatory process or abnormal fluid collections. Normal appendix visualized.  Vascular/Lymphatic: No pathologically enlarged lymph nodes. No abdominal aortic aneurysm.  Reproductive:  No mass or other significant abnormality.  Other:  None.  Musculoskeletal:  No suspicious bone lesions identified.  IMPRESSION: 1. Bilateral renal medullary nephrocalcinosis. No evidence of ureteral calculi or hydronephrosis. 2. No radiographic evidence of urinary tract neoplasm.   Electronically Signed By: Danae Orleans M.D. On: 02/16/2020 11:52  Results for orders placed during the hospital encounter of 11/03/21  CT Renal Stone Study  Narrative CLINICAL DATA:  Flank pain, kidney stone suspected  EXAM: CT ABDOMEN AND PELVIS WITHOUT CONTRAST  TECHNIQUE: Multidetector CT imaging of the abdomen and pelvis was performed following the standard protocol without IV contrast.  RADIATION DOSE REDUCTION: This exam was performed according to the departmental dose-optimization program which includes automated exposure control, adjustment of the mA and/or kV according to patient size and/or use of iterative reconstruction technique.  COMPARISON:  04/06/2021.  FINDINGS: Lower chest: Mild dependent  atelectasis is present at the lung bases.  Hepatobiliary: No focal liver abnormality is seen. No gallstones, gallbladder wall thickening, or biliary dilatation.  Pancreas: Unremarkable. No pancreatic ductal dilatation or surrounding inflammatory changes.  Spleen: Normal in size without focal abnormality.  Adrenals/Urinary Tract: The adrenal glands are within normal limits. Renal calculi are present bilaterally in the renal pyramids are hyperdense. There is mild obstructive uropathy on the left with a 5 mm calculus in at the ureterovesicular junction on the left. No obstructive uropathy on the right. The bladder is otherwise within normal limits.  Stomach/Bowel: Ingested debris is present in the stomach. No bowel obstruction, free air, or pneumatosis. A normal appendix is present in the right lower quadrant. No focal bowel wall thickening.  Vascular/Lymphatic: No significant vascular findings are present. No enlarged abdominal or pelvic lymph nodes.  Reproductive: Uterus and bilateral adnexa are unremarkable.  Other: No free fluid. A small fat containing umbilical hernia is noted.  Musculoskeletal: Mild degenerative changes are noted in the thoracolumbar spine. No acute osseous abnormality.  IMPRESSION: 1. Mild  obstructive uropathy on the left with a 5 mm calculus at the UVJ on the left. 2. Bilateral nephrolithiasis. 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   Electronically Signed By: Thornell Sartorius M.D. On: 11/04/2021 02:10   Assessment & Plan:    1. Recurrent UTI -macrodantin 50mg  qhs prn - mirabegron ER (MYRBETRIQ) 25 MG TB24 tablet; Take 1 tablet (25 mg total) by mouth daily.  Dispense: 90 tablet; Refill: 3  2. Nephrolithiasis -followup 6 months - Urinalysis, Routine w reflex microscopic  3. OAB (overactive bladder) -mirabegron 25mg  daily   No follow-ups on file.  Wilkie Aye, MD  [facility] [place]

## 2023-01-16 LAB — MICROSCOPIC EXAMINATION: Bacteria, UA: NONE SEEN

## 2023-01-16 LAB — URINALYSIS, ROUTINE W REFLEX MICROSCOPIC
Bilirubin, UA: NEGATIVE
Glucose, UA: NEGATIVE
Ketones, UA: NEGATIVE
Leukocytes,UA: NEGATIVE
Nitrite, UA: NEGATIVE
Protein,UA: NEGATIVE
Specific Gravity, UA: <1.005 — ABNORMAL LOW (ref 1.005–1.030)
Urobilinogen, Ur: 1 mg/dL (ref 0.2–1.0)
pH, UA: 6 (ref 5.0–7.5)

## 2023-01-21 ENCOUNTER — Encounter: Payer: Self-pay | Admitting: Urology

## 2023-01-21 NOTE — Patient Instructions (Signed)

## 2023-07-21 ENCOUNTER — Other Ambulatory Visit: Payer: Self-pay

## 2023-07-21 ENCOUNTER — Telehealth: Payer: Self-pay | Admitting: Urology

## 2023-07-21 DIAGNOSIS — N200 Calculus of kidney: Secondary | ICD-10-CM

## 2023-07-21 NOTE — Telephone Encounter (Signed)
Patient called this morning to cancel her appointment she has not had ultrasound done yet.  She needs an order put in for ultrasound and have it scheduled for January, right now she can't afford to have it done.    Patient would also like you to extend her medication to January until she can see [facility] 10/08/23

## 2023-07-21 NOTE — Telephone Encounter (Signed)
Order placed.  Prescription sent in May was for a year. Patient just need to call pharmacy for refill. Patient called with no answer. Message left to return call.

## 2023-07-23 ENCOUNTER — Ambulatory Visit: Payer: BC Managed Care – PPO | Admitting: Urology

## 2023-08-11 ENCOUNTER — Other Ambulatory Visit: Payer: Self-pay | Admitting: Urology

## 2023-08-11 DIAGNOSIS — N200 Calculus of kidney: Secondary | ICD-10-CM

## 2023-10-06 ENCOUNTER — Ambulatory Visit (HOSPITAL_COMMUNITY): Admission: RE | Admit: 2023-10-06 | Payer: 59 | Source: Ambulatory Visit

## 2023-10-06 ENCOUNTER — Encounter (HOSPITAL_COMMUNITY): Payer: Self-pay

## 2023-10-08 ENCOUNTER — Ambulatory Visit: Payer: 59 | Admitting: Urology

## 2023-10-08 VITALS — BP 132/82 | HR 87

## 2023-10-08 DIAGNOSIS — Z87442 Personal history of urinary calculi: Secondary | ICD-10-CM

## 2023-10-08 DIAGNOSIS — N390 Urinary tract infection, site not specified: Secondary | ICD-10-CM

## 2023-10-08 DIAGNOSIS — N200 Calculus of kidney: Secondary | ICD-10-CM

## 2023-10-08 DIAGNOSIS — Z87440 Personal history of urinary (tract) infections: Secondary | ICD-10-CM | POA: Diagnosis not present

## 2023-10-08 DIAGNOSIS — N3941 Urge incontinence: Secondary | ICD-10-CM | POA: Diagnosis not present

## 2023-10-08 DIAGNOSIS — N3281 Overactive bladder: Secondary | ICD-10-CM | POA: Diagnosis not present

## 2023-10-08 MED ORDER — NITROFURANTOIN MONOHYD MACRO 100 MG PO CAPS: 100.0000 mg | ORAL_CAPSULE | ORAL | 11 refills | Status: AC | PRN

## 2023-10-08 MED ORDER — TAMSULOSIN HCL 0.4 MG PO CAPS: 0.4000 mg | ORAL_CAPSULE | Freq: Every day | ORAL | 3 refills | Status: AC

## 2023-10-08 MED ORDER — GEMTESA 75 MG PO TABS
1.0000 | ORAL_TABLET | Freq: Every day | ORAL | Status: DC
Start: 2023-10-08 — End: 2024-01-01

## 2023-10-08 NOTE — Progress Notes (Signed)
10/08/2023 3:09 PM   Kristie Davidson 01-16-1976 [id number]  Referring provider: Practice, [facility] [address] [place],  [place] [address]  Followup OAB and recurrent UTI   HPI: Kristie Davidson is a 47yo here for followup for recurrent UTI, nephrolithiasis, and OAB. No stone events since last visit. No flank pain. No UTIs since last visit. She is on macrodantin 50mg  prophylaxis. She has issues with urge incontiennce and was previously on mirabegron. Insurance denied mirabegron.     PMH: Past Medical History:  Diagnosis Date   Acid reflux    Anxiety    Depression    High cholesterol     Surgical History: Past Surgical History:  Procedure Laterality Date   BREAST REDUCTION SURGERY     CESAREAN SECTION     MOUTH SURGERY  2000   NOSE SURGERY      Home Medications:  Allergies as of 10/08/2023       Reactions   Sumatriptan    Nausea,cold sweats,tremors,chills        Medication List        Accurate as of October 08, 2023  3:09 PM. If you have any questions, ask your nurse or doctor.          STOP taking these medications    HYDROcodone-acetaminophen 5-325 MG tablet Commonly known as: NORCO/VICODIN   mirabegron ER 25 MG Tb24 tablet Commonly known as: MYRBETRIQ   ondansetron 4 MG disintegrating tablet Commonly known as: ZOFRAN-ODT   phenazopyridine 100 MG tablet Commonly known as: Pyridium       TAKE these medications    amphetamine-dextroamphetamine 20 MG tablet Commonly known as: ADDERALL Take 20 mg by mouth 2 (two) times daily.   BIOTIN PO Take 1 capsule by mouth daily.   cetirizine 10 MG tablet Commonly known as: ZYRTEC Take 10 mg by mouth daily.   ferrous sulfate 325 (65 FE) MG tablet Take 325 mg by mouth daily with breakfast.   Latuda 40 MG Tabs tablet Generic drug: lurasidone   LORazepam 1 MG tablet Commonly known as: ATIVAN Take 1 mg by mouth as needed for anxiety.   MAGNESIUM PO Take 1 tablet by mouth daily.   montelukast  10 MG tablet Commonly known as: SINGULAIR Take 10 mg by mouth daily.   multivitamin tablet Take 1 tablet by mouth daily.   naproxen sodium 220 MG tablet Commonly known as: ALEVE Take 220 mg by mouth daily as needed (pain/headache).   nitrofurantoin (macrocrystal-monohydrate) 100 MG capsule Commonly known as: MACROBID Take 1 capsule (100 mg total) by mouth every 12 (twelve) hours.   nitrofurantoin (macrocrystal-monohydrate) 100 MG capsule Commonly known as: MACROBID Take 1 capsule (100 mg total) by mouth as needed.   nitrofurantoin 50 MG capsule Commonly known as: Macrodantin Take 1 capsule (50 mg total) by mouth at bedtime as needed.   omeprazole 20 MG capsule Commonly known as: PRILOSEC Take 20 mg by mouth daily.   Pristiq 100 MG 24 hr tablet Generic drug: desvenlafaxine   simvastatin 20 MG tablet Commonly known as: ZOCOR Take 20 mg by mouth daily.   tamsulosin 0.4 MG Caps capsule Commonly known as: FLOMAX TAKE 1 CAPSULE BY MOUTH EVERY DAY        Allergies:  Allergies  Allergen Reactions   Sumatriptan     Nausea,cold sweats,tremors,chills    Family History: Family History  Problem Relation Age of Onset   Kidney Stones Mother    Kidney Stones Sister  Social History:  reports that she has never smoked. She has never used smokeless tobacco. No history on file for alcohol use and drug use.  ROS: All other review of systems were reviewed and are negative except what is noted above in HPI  Physical Exam: BP 132/82   Pulse 87   Constitutional:  Alert and oriented, No acute distress. HEENT: [place] AT, moist mucus membranes.  Trachea midline, no masses. Cardiovascular: No clubbing, cyanosis, or edema. Respiratory: Normal respiratory effort, no increased work of breathing. GI: Abdomen is soft, nontender, nondistended, no abdominal masses GU: No CVA tenderness.  Lymph: No cervical or inguinal lymphadenopathy. Skin: No rashes, bruises or suspicious  lesions. Neurologic: Grossly intact, no focal deficits, moving all 4 extremities. Psychiatric: Normal mood and affect.  Laboratory Data: Lab Results  Component Value Date   WBC 8.7 11/03/2021   HGB 13.9 11/03/2021   HCT 38.4 11/03/2021   MCV 88.5 11/03/2021   PLT 257 11/03/2021    Lab Results  Component Value Date   CREATININE 0.80 11/03/2021    No results found for: "PSA"  No results found for: "TESTOSTERONE"  No results found for: "HGBA1C"  Urinalysis    Component Value Date/Time   COLORURINE ORANGE (A) 11/04/2021 0312   APPEARANCEUR Clear 01/15/2023 1539   LABSPEC 1.023 11/04/2021 0312   PHURINE 5.0 11/04/2021 0312   GLUCOSEU Negative 01/15/2023 1539   HGBUR MODERATE (A) 11/04/2021 0312   BILIRUBINUR Negative 01/15/2023 1539   KETONESUR NEGATIVE 11/04/2021 0312   PROTEINUR Negative 01/15/2023 1539   PROTEINUR NEGATIVE 11/04/2021 0312   UROBILINOGEN 0.2 01/12/2020 1552   NITRITE Negative 01/15/2023 1539   NITRITE POSITIVE (A) 11/04/2021 0312   LEUKOCYTESUR Negative 01/15/2023 1539   LEUKOCYTESUR NEGATIVE 11/04/2021 0312    Lab Results  Component Value Date   LABMICR See below: 01/15/2023   WBCUA 0-5 01/15/2023   LABEPIT 0-10 01/15/2023   BACTERIA None seen 01/15/2023    Pertinent Imaging:  Results for orders placed in visit on 01/14/23  DG Abd 1 View  Narrative CLINICAL DATA:  Kidney stones  EXAM: ABDOMEN - 1 VIEW  COMPARISON:  11/13/2022  FINDINGS: Increased stool throughout colon.  No definite urinary tract calcifications.  Small LEFT pelvic phleboliths stable.  Osseous structures unremarkable.  IMPRESSION: No urinary tract calcifications identified.   Electronically Signed By: Ulyses Southward M.D. On: 01/16/2023 15:43  No results found for this or any previous visit.  No results found for this or any previous visit.  No results found for this or any previous visit.  Results for orders placed during the hospital encounter of  09/21/20  Ultrasound renal complete  Narrative CLINICAL DATA:  Nephrolithiasis  EXAM: RENAL / URINARY TRACT ULTRASOUND COMPLETE  COMPARISON:  Renal ultrasound November 18, 2019; CT abdomen and pelvis February 15, 2020  FINDINGS: Right Kidney:  Renal measurements: 10.9 x 4.8 x 6.2 cm = volume: 170.3 mL. Echogenicity within normal limits. No mass or hydronephrosis visualized. Subtle apparent medullary nephrocalcinosis, better seen on recent CT. No ureterectasis.  Left Kidney:  Renal measurements: 10.8 x 5.5 x 4.6 cm = volume: 141.4 mL. Echogenicity within normal limits. No mass or hydronephrosis visualized. Subtle apparent medullary nephrocalcinosis, better seen on recent CT. No ureterectasis.  Bladder:  Appears normal for degree of bladder distention. Flow from each distal ureter seen in the bladder.  Other:  None.  IMPRESSION: 1. Subtle changes of apparent medullary nephrocalcinosis, better seen on prior CT.  2. Kidneys otherwise appear normal  bilaterally. No lesion involving urinary bladder.   Electronically Signed By: Bretta Bang III M.D. On: 09/21/2020 14:09  No results found for this or any previous visit.  Results for orders placed during the hospital encounter of 02/15/20  CT HEMATURIA WORKUP  Narrative CLINICAL DATA:  Gross hematuria. Recurrent urinary tract infections.  EXAM: CT ABDOMEN AND PELVIS WITHOUT AND WITH CONTRAST  TECHNIQUE: Multidetector CT imaging of the abdomen and pelvis was performed following the standard protocol before and following the bolus administration of intravenous contrast.  CONTRAST:  OMNIPAQUE IOHEXOL 300 MG/ML  SOLN  COMPARISON:  None.  FINDINGS: Lower Chest: No acute findings.  Hepatobiliary: No hepatic masses identified. Gallbladder is unremarkable. No evidence of biliary ductal dilatation.  Pancreas:  No mass or inflammatory changes.  Spleen: Within normal limits in size and  appearance.  Adrenals/Urinary Tract: No adrenal masses identified. Numerous tiny calcifications are seen throughout the medullary pyramids both kidneys, consistent with medullary nephrocalcinosis. No evidence ureteral calculi or hydronephrosis. No renal masses identified. No masses seen involving the ureters or bladder.  Stomach/Bowel: No evidence of obstruction, inflammatory process or abnormal fluid collections. Normal appendix visualized.  Vascular/Lymphatic: No pathologically enlarged lymph nodes. No abdominal aortic aneurysm.  Reproductive:  No mass or other significant abnormality.  Other:  None.  Musculoskeletal:  No suspicious bone lesions identified.  IMPRESSION: 1. Bilateral renal medullary nephrocalcinosis. No evidence of ureteral calculi or hydronephrosis. 2. No radiographic evidence of urinary tract neoplasm.   Electronically Signed By: Danae Orleans M.D. On: 02/16/2020 11:52  Results for orders placed during the hospital encounter of 11/03/21  CT Renal Stone Study  Narrative CLINICAL DATA:  Flank pain, kidney stone suspected  EXAM: CT ABDOMEN AND PELVIS WITHOUT CONTRAST  TECHNIQUE: Multidetector CT imaging of the abdomen and pelvis was performed following the standard protocol without IV contrast.  RADIATION DOSE REDUCTION: This exam was performed according to the departmental dose-optimization program which includes automated exposure control, adjustment of the mA and/or kV according to patient size and/or use of iterative reconstruction technique.  COMPARISON:  04/06/2021.  FINDINGS: Lower chest: Mild dependent atelectasis is present at the lung bases.  Hepatobiliary: No focal liver abnormality is seen. No gallstones, gallbladder wall thickening, or biliary dilatation.  Pancreas: Unremarkable. No pancreatic ductal dilatation or surrounding inflammatory changes.  Spleen: Normal in size without focal abnormality.  Adrenals/Urinary Tract: The  adrenal glands are within normal limits. Renal calculi are present bilaterally in the renal pyramids are hyperdense. There is mild obstructive uropathy on the left with a 5 mm calculus in at the ureterovesicular junction on the left. No obstructive uropathy on the right. The bladder is otherwise within normal limits.  Stomach/Bowel: Ingested debris is present in the stomach. No bowel obstruction, free air, or pneumatosis. A normal appendix is present in the right lower quadrant. No focal bowel wall thickening.  Vascular/Lymphatic: No significant vascular findings are present. No enlarged abdominal or pelvic lymph nodes.  Reproductive: Uterus and bilateral adnexa are unremarkable.  Other: No free fluid. A small fat containing umbilical hernia is noted.  Musculoskeletal: Mild degenerative changes are noted in the thoracolumbar spine. No acute osseous abnormality.  IMPRESSION: 1. Mild obstructive uropathy on the left with a 5 mm calculus at the UVJ on the left. 2. Bilateral nephrolithiasis. 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   Electronically Signed By: Thornell Sartorius M.D. On: 11/04/2021 02:10   Assessment & Plan:    1. Nephrolithiasis (Primary) -followup 1 year  with [facility] - Urinalysis, Routine w reflex microscopic  2. Recurrent UTI -continue macrodantin 50mg  qhs  3. OAB (overactive bladder) -we will trial gemtesa 75mg  daily Patient to continue flomax 0.4mg  daily   No follow-ups on file.  Wilkie Aye, MD  [facility] [place]

## 2023-10-09 LAB — URINALYSIS, ROUTINE W REFLEX MICROSCOPIC
Bilirubin, UA: NEGATIVE
Glucose, UA: NEGATIVE
Ketones, UA: NEGATIVE
Leukocytes,UA: NEGATIVE
Nitrite, UA: NEGATIVE
Protein,UA: NEGATIVE
RBC, UA: NEGATIVE
Specific Gravity, UA: 1.03 (ref 1.005–1.030)
Urobilinogen, Ur: 0.2 mg/dL (ref 0.2–1.0)
pH, UA: 6 (ref 5.0–7.5)

## 2023-10-14 ENCOUNTER — Encounter: Payer: Self-pay | Admitting: Urology

## 2023-10-14 NOTE — Patient Instructions (Signed)

## 2024-01-01 ENCOUNTER — Telehealth: Payer: Self-pay | Admitting: Urology

## 2024-01-01 ENCOUNTER — Other Ambulatory Visit: Payer: Self-pay

## 2024-01-01 MED ORDER — GEMTESA 75 MG PO TABS: 1.0000 | ORAL_TABLET | Freq: Every day | ORAL | 11 refills | Status: AC

## 2024-01-01 NOTE — Telephone Encounter (Signed)
 Rx sent

## 2024-01-01 NOTE — Telephone Encounter (Signed)
 Patient needs refill sent to [facility] pharmacy for Gemtesa

## 2024-01-05 ENCOUNTER — Telehealth: Payer: Self-pay

## 2024-01-05 NOTE — Telephone Encounter (Signed)
 Medication prior authorization request received.  Completed PA request through cover my meds for drug Gemtesa . KEY: [id number]  Approved: Yes

## 2024-10-10 ENCOUNTER — Other Ambulatory Visit: Payer: Self-pay

## 2024-10-10 DIAGNOSIS — N200 Calculus of kidney: Secondary | ICD-10-CM

## 2024-10-11 ENCOUNTER — Ambulatory Visit: Payer: 59 | Admitting: Urology

## 2025-04-27 ENCOUNTER — Ambulatory Visit: Admitting: Urology
